# Patient Record
Sex: Female | Born: 1970 | Race: Black or African American | Hispanic: No | Marital: Single | Smoking: Never smoker
Health system: Southern US, Community
[De-identification: ages and names within clinical notes are randomized; demographics above are authoritative.]

## PROBLEM LIST (undated history)

## (undated) DIAGNOSIS — R7611 Nonspecific reaction to tuberculin skin test without active tuberculosis: Secondary | ICD-10-CM

## (undated) DIAGNOSIS — E785 Hyperlipidemia, unspecified: Secondary | ICD-10-CM

## (undated) DIAGNOSIS — I10 Essential (primary) hypertension: Secondary | ICD-10-CM

## (undated) HISTORY — PX: TUBAL LIGATION: SHX77

## (undated) HISTORY — DX: Nonspecific reaction to tuberculin skin test without active tuberculosis: R76.11

## (undated) HISTORY — DX: Essential (primary) hypertension: I10

## (undated) HISTORY — DX: Hyperlipidemia, unspecified: E78.5

---

## 2013-05-26 ENCOUNTER — Other Ambulatory Visit: Payer: Self-pay | Admitting: Internal Medicine

## 2013-05-26 DIAGNOSIS — Z1231 Encounter for screening mammogram for malignant neoplasm of breast: Secondary | ICD-10-CM

## 2013-06-16 ENCOUNTER — Ambulatory Visit: Payer: Self-pay

## 2013-06-19 ENCOUNTER — Ambulatory Visit
Admission: RE | Admit: 2013-06-19 | Discharge: 2013-06-19 | Disposition: A | Discharge status: Home / Self Care | Payer: Medicaid Other | Source: Ambulatory Visit | Attending: Internal Medicine | Admitting: Internal Medicine

## 2013-06-19 DIAGNOSIS — Z1231 Encounter for screening mammogram for malignant neoplasm of breast: Secondary | ICD-10-CM

## 2013-10-03 ENCOUNTER — Ambulatory Visit: Payer: Self-pay | Admitting: Family

## 2013-10-13 ENCOUNTER — Ambulatory Visit (INDEPENDENT_AMBULATORY_CARE_PROVIDER_SITE_OTHER): Payer: 59 | Admitting: Family

## 2013-10-13 ENCOUNTER — Encounter: Payer: Self-pay | Admitting: Family

## 2013-10-13 VITALS — BP 128/90 | HR 77 | Temp 97.9°F | Resp 16 | Ht 65.0 in | Wt 215.0 lb

## 2013-10-13 DIAGNOSIS — R0683 Snoring: Secondary | ICD-10-CM

## 2013-10-13 DIAGNOSIS — R0989 Other specified symptoms and signs involving the circulatory and respiratory systems: Secondary | ICD-10-CM

## 2013-10-13 DIAGNOSIS — M5432 Sciatica, left side: Secondary | ICD-10-CM | POA: Insufficient documentation

## 2013-10-13 DIAGNOSIS — R32 Unspecified urinary incontinence: Secondary | ICD-10-CM

## 2013-10-13 DIAGNOSIS — M5430 Sciatica, unspecified side: Secondary | ICD-10-CM

## 2013-10-13 DIAGNOSIS — N644 Mastodynia: Secondary | ICD-10-CM

## 2013-10-13 DIAGNOSIS — I10 Essential (primary) hypertension: Secondary | ICD-10-CM

## 2013-10-13 DIAGNOSIS — R0609 Other forms of dyspnea: Secondary | ICD-10-CM

## 2013-10-13 DIAGNOSIS — R7611 Nonspecific reaction to tuberculin skin test without active tuberculosis: Secondary | ICD-10-CM | POA: Insufficient documentation

## 2013-10-13 HISTORY — DX: Nonspecific reaction to tuberculin skin test without active tuberculosis: R76.11

## 2013-10-13 NOTE — Patient Instructions (Signed)
You can try aleve 220mg  twice daily as needed for left leg buring. Try to eliminate all caffeine from your diet. Add a vitamin E supplement.   Schedule a fasting physical at your earliest convenience. Welcome to [facility]!

## 2013-10-13 NOTE — Assessment & Plan Note (Signed)
BP stable.  Monitor off meds.

## 2013-10-13 NOTE — Assessment & Plan Note (Signed)
Recommend avoid caffeine, trial of vitamin E.

## 2013-10-13 NOTE — Assessment & Plan Note (Signed)
With fatigue and snoring ? OSA. Recommend sleep study.

## 2013-10-13 NOTE — Assessment & Plan Note (Signed)
Mild sciatic pain.  Trial of meloxicam.

## 2013-10-13 NOTE — Progress Notes (Signed)
Subjective:    Patient ID: Carmen Mann, female    DOB: [birth year], 42 y.o.   MRN: [id number]  HPI  Establish Care Pt new to establish care. She presents with chief complaint of breast Pain Pt reports intermittent bilateral breast pain x 1 year. Reports recent mammogram this year was normal. She cannot correlate breast pain with her menstrual cycle.  Told that she has dense breasts.  Denies breast leakage.   Reports that prior to the breast pain she has had formal work up for weight loss back in 2012.  Had DOE at that time. At that time her weight went down to 160.  She reports that her work up was negative.  She saw oncology, neurology.  Reports thyroid function was negative.  She does have fatigue still at times, but it is intermittent.    HTN-  Reports that back in 2010 she was treated on atenolol.  Does not specifically watch her sodium.    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   Urinary incontinence- started back in 2010.  Occurs at night with sleeping.  Occurs at night only. + snoring.     Review of Systems  Constitutional:       Weight is up/down for 6 months  HENT: Negative for rhinorrhea.   Respiratory: Negative for cough.   Cardiovascular: Negative for chest pain.  Gastrointestinal: Negative for vomiting and diarrhea.  Genitourinary: Negative for dysuria.  Musculoskeletal: Negative for arthralgias.  Skin: Negative for rash.  Neurological: Negative for headaches.  Hematological: Negative for adenopathy.  Psychiatric/Behavioral:       Denies depression/anxiety   Past Medical History  Diagnosis Date  . Hypertension   . Hyperlipidemia   . History of positive PPD, untreated     History   Social History  . Marital Status: Married    Spouse Name: N/A    Number of Children: N/A  . Years of Education: N/A   Occupational History  . Not on file.   Social History Main Topics  . Smoking status: Never Smoker   . Smokeless tobacco: Never Used  . Alcohol Use: Not on file   . Drug Use: Not on file  . Sexual Activity: Not on file   Other Topics Concern  . 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 Cesarean section      Family History  Problem Relation Age of Onset  . Hypertension Mother   . Cancer Father     prostate    No Known Allergies  No current outpatient prescriptions on file prior to visit.   No current facility-administered medications on file prior to visit.    BP 128/90  Pulse 77  Temp(Src) 97.9 F (36.6 C) (Oral)  Resp 16  Ht 5\' 5"  (1.651 m)  Wt 215 lb (97.523 kg)  BMI 35.78 kg/m2  SpO2 99%  LMP 09/25/2013        Objective:   Physical Exam  Constitutional: She is oriented to person, place, and time. She appears well-developed and well-nourished. No distress.  HENT:  Head: Normocephalic and atraumatic.  Cardiovascular: Normal rate and  regular rhythm.   No murmur heard. Pulmonary/Chest: Effort normal and breath sounds normal. No respiratory distress. She has no wheezes. She has no rales. She exhibits no tenderness.  Genitourinary:  Breasts: Examined lying and sitting.  Right: Without masses, retractions, discharge or axillary adenopathy.  Left: Without masses, retractions, discharge or axillary adenopathy.    Neurological: She is alert and oriented to person, place, and time.  Psychiatric: She has a normal mood and affect. Her behavior is normal. Judgment and thought content normal.  MS: bilateral LE strength is 5/5        Assessment & Plan:

## 2013-11-08 ENCOUNTER — Encounter (HOSPITAL_BASED_OUTPATIENT_CLINIC_OR_DEPARTMENT_OTHER): Payer: 59

## 2014-01-31 ENCOUNTER — Encounter: Payer: Self-pay | Admitting: Physician Assistant

## 2014-01-31 ENCOUNTER — Ambulatory Visit (INDEPENDENT_AMBULATORY_CARE_PROVIDER_SITE_OTHER): Payer: 59 | Admitting: Physician Assistant

## 2014-01-31 ENCOUNTER — Ambulatory Visit: Payer: 59 | Admitting: Family

## 2014-01-31 VITALS — BP 142/98 | HR 76 | Temp 98.7°F | Resp 14 | Ht 65.0 in | Wt 211.0 lb

## 2014-01-31 DIAGNOSIS — R0989 Other specified symptoms and signs involving the circulatory and respiratory systems: Secondary | ICD-10-CM

## 2014-01-31 DIAGNOSIS — R635 Abnormal weight gain: Secondary | ICD-10-CM

## 2014-01-31 DIAGNOSIS — F458 Other somatoform disorders: Secondary | ICD-10-CM

## 2014-01-31 LAB — TSH: TSH: 1.223 u[IU]/mL (ref 0.350–4.500)

## 2014-01-31 MED ORDER — DEXLANSOPRAZOLE 60 MG PO CPDR: 60.0000 mg | DELAYED_RELEASE_CAPSULE | Freq: Every day | ORAL | Status: DC

## 2014-01-31 NOTE — Progress Notes (Signed)
Pre visit review using our clinic review tool, if applicable. No additional management support is needed unless otherwise documented below in the visit note/SLS  

## 2014-01-31 NOTE — Patient Instructions (Addendum)
Please obtain labs. I will call you with your results.  Please take Dexilant daily.  Avoid late-night eating.  You will be contacted by GI for an appointment and further evaluation.  Dysphagia Swallowing problems (dysphagia) occur when solids and liquids seem to stick in your throat on the way down to your stomach, or the food takes longer to get to the stomach. Other symptoms include regurgitating food, noises coming from the throat, chest discomfort with swallowing, and a feeling of fullness or the feeling of something being stuck in your throat when swallowing. When blockage in your throat is complete it may be associated with drooling. CAUSES  Problems with swallowing may occur because of problems with the muscles. The food cannot be propelled in the usual manner into your stomach. You may have ulcers, scar tissue, or inflammation in the tube down which food travels from your mouth to your stomach (esophagus), which blocks food from passing normally into the stomach. Causes of inflammation include:  Acid reflux from your stomach into your esophagus.  Infection.  Radiation treatment for cancer.  Medicines taken without enough fluids to wash them down into your stomach. You may have nerve problems that prevent signals from being sent to the muscles of your esophagus to contract and move your food down to your stomach. Globus pharyngeus is a relatively common problem in which there is a sense of an obstruction or difficulty in swallowing, without any physical abnormalities of the swallowing passages being found. This problem usually improves over time with reassurance and testing to rule out other causes. DIAGNOSIS Dysphagia can be diagnosed and its cause can be determined by tests in which you swallow a white substance that helps illuminate the inside of your throat (contrast medium) while X-rays are taken. Sometimes a flexible telescope that is inserted down your throat (endoscopy) to look at your  esophagus and stomach is used. TREATMENT   If the dysphagia is caused by acid reflux or infection, medicines may be used.  If the dysphagia is caused by problems with your swallowing muscles, swallowing therapy may be used to help you strengthen your swallowing muscles.  If the dysphagia is caused by a blockage or mass, procedures to remove the blockage may be done. HOME CARE INSTRUCTIONS  Try to eat soft food that is easier to swallow and check your weight on a daily basis to be sure that it is not decreasing.  Be sure to drink liquids when sitting upright (not lying down). SEEK MEDICAL CARE IF:  You are losing weight because you are unable to swallow.  You are coughing when you drink liquids (aspiration).  You are coughing up partially digested food. SEEK IMMEDIATE MEDICAL CARE IF:  You are unable to swallow your own saliva .  You are having shortness of breath or a fever, or both.  You have a hoarse voice along with difficulty swallowing. MAKE SURE YOU:  Understand these instructions.  Will watch your condition.  Will get help right away if you are not doing well or get worse. Document Released: 11/13/2000 Document Revised: 07/19/2013 Document Reviewed: 05/05/2013 [facility] Patient Information 2014 [place], [place].

## 2014-02-02 DIAGNOSIS — R0989 Other specified symptoms and signs involving the circulatory and respiratory systems: Secondary | ICD-10-CM | POA: Insufficient documentation

## 2014-02-02 DIAGNOSIS — R09A2 Foreign body sensation, throat: Secondary | ICD-10-CM | POA: Insufficient documentation

## 2014-02-02 DIAGNOSIS — R635 Abnormal weight gain: Secondary | ICD-10-CM | POA: Insufficient documentation

## 2014-02-02 NOTE — Assessment & Plan Note (Signed)
Possibly due to silent reflux symptoms.  Will give sample of Dexilant for 2-week trial.  Has had ENT workup before for similar symptoms.  Workup was negative. Will refer patient to GI for evaluation and possible EGD or Swallow study.

## 2014-02-02 NOTE — Assessment & Plan Note (Signed)
Will check TSH

## 2014-02-02 NOTE — Progress Notes (Signed)
Patient presents to clinic today c/o lump in her throat and difficulty swallowing that has been present intermittently for the past 3 weeks.  Patient states she has had this previously over the past 5 months, but states it has not been this frequent.  Symptoms occuring a few times per week now. Denies inability to swallow solids or liquids.  "Just feels difficult sometimes". Endorses occasional cough.  Denies fever, chills, sweats, abdominal pain. Denies indigestion.  Symptoms are worse after meals and at bedtime with lying down.  Patient denies palpitations, chest pain, SOB or diaphoresis. Patient states she has seen ENT for this in the past with unremarkable workup.    Patient also requesting check of her thyroid due to unexplainable weight gain.  Has + family hx of tyroid disorder.  No personal history.  Denies excess fatigue, slowed mentation or cold insensitivity.  Past Medical History  Diagnosis Date  . Hypertension   . Hyperlipidemia   . History of positive PPD, untreated   . Positive PPD 10/13/2013    Hx BCG    Current Outpatient Prescriptions on File Prior to Visit  Medication Sig Dispense Refill  . vitamin E (VITAMIN E) 400 UNIT capsule Take 400 Units by mouth daily.       No current facility-administered medications on file prior to visit.    No Known Allergies  Family History  Problem Relation Age of Onset  . Hypertension Mother   . Cancer Father     prostate    History   Social History  . Marital Status: Married    Spouse Name: N/A    Number of Children: N/A  . Years of Education: N/A   Social History Main Topics  . Smoking status: Never Smoker   . Smokeless tobacco: Never Used  . Alcohol Use: None  . Drug Use: None  . Sexual Activity: None   Other Topics Concern  . 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  All other ROS are negative.  BP 142/98  Pulse 76  Temp(Src) 98.7 F (37.1 C) (Oral)  Resp 14  Ht 5\' 5"  (1.651 m)  Wt 211 lb (95.709 kg)  BMI 35.11 kg/m2  SpO2 98%  LMP 01/31/2014  Physical Exam  Constitutional: She is oriented to person, place, and time and well-developed, well-nourished, and in no distress.  HENT:  Head: Normocephalic and atraumatic.  Right Ear: Tympanic membrane, external ear and ear canal normal.  Left Ear: Tympanic membrane, external ear and ear canal normal.  Nose: Nose normal.  Mouth/Throat: Uvula is midline, oropharynx is clear and moist and mucous membranes are normal. No oropharyngeal exudate, posterior oropharyngeal edema, posterior oropharyngeal erythema or tonsillar abscesses.  Eyes: Conjunctivae are normal. Pupils are equal, round, and reactive to light.  Neck: Normal range of motion. Neck supple. No tracheal deviation present. No mass and no thyromegaly present.  Cardiovascular: Normal rate, regular rhythm and normal heart sounds.   Pulmonary/Chest: Effort normal and breath sounds normal. No stridor.  Abdominal: Soft. Bowel sounds are normal. She exhibits no distension and no mass. There is no tenderness. There is no rebound and no guarding.  Lymphadenopathy:    She has no cervical adenopathy.  Neurological: She is alert and oriented to person, place, and time. No cranial nerve deficit.  Skin: Skin is warm and dry. No rash noted.  Psychiatric: Affect normal.   Recent Results (from the past 2160 hour(s))  TSH     Status: None   Collection Time    01/31/14  1:32 PM      Result Value Ref Range   TSH 1.223  0.350 - 4.500 uIU/mL   Assessment/Plan: Globus sensation Possibly due to silent reflux symptoms.  Will give sample of Dexilant for 2-week trial.  Has had ENT workup before for similar symptoms.  Workup was negative. Will refer patient to GI  for evaluation and possible EGD or Swallow study.  Weight gain Will check TSH

## 2014-02-27 ENCOUNTER — Encounter: Payer: Self-pay | Admitting: Internal Medicine

## 2014-04-06 ENCOUNTER — Encounter: Payer: Self-pay | Admitting: Gastroenterology

## 2014-04-18 ENCOUNTER — Ambulatory Visit: Payer: 59 | Admitting: Internal Medicine

## 2014-04-25 ENCOUNTER — Telehealth: Payer: Self-pay | Admitting: Internal Medicine

## 2014-04-25 ENCOUNTER — Encounter: Payer: Self-pay | Admitting: Internal Medicine

## 2014-04-25 NOTE — Telephone Encounter (Signed)
Note not needed 

## 2014-04-26 ENCOUNTER — Encounter: Payer: Self-pay | Admitting: Internal Medicine

## 2014-07-02 ENCOUNTER — Ambulatory Visit (INDEPENDENT_AMBULATORY_CARE_PROVIDER_SITE_OTHER): Payer: 59 | Admitting: Internal Medicine

## 2014-07-02 ENCOUNTER — Encounter: Payer: Self-pay | Admitting: Internal Medicine

## 2014-07-02 VITALS — BP 130/70 | HR 80 | Ht 65.0 in | Wt 192.4 lb

## 2014-07-02 DIAGNOSIS — R1310 Dysphagia, unspecified: Secondary | ICD-10-CM

## 2014-07-02 DIAGNOSIS — R0989 Other specified symptoms and signs involving the circulatory and respiratory systems: Secondary | ICD-10-CM

## 2014-07-02 DIAGNOSIS — F458 Other somatoform disorders: Secondary | ICD-10-CM

## 2014-07-02 NOTE — Progress Notes (Signed)
[facility]  Carmen Mann    [id number]    01-28-1971    Assessment and Plan/Recommendations: Globus Sensation: Chronic history.Cause not clear - Ba swallow to assess    Dysphagia: Vague history but warrants barium swallow and will most likely need EGD as well.we have scheduled the EGD in anticipation - The risks and benefits as well as alternatives of endoscopic procedure(s) have been discussed and reviewed. All questions answered. The patient agrees to proceed.  Cancer fear - she is concerned that the sxs above could ean she has cancer - EGD should reassure - I doubt chronic globus related to cancer    HPI: Carmen Mann is a 43 y.o. AA female with a history of globus sensation and dysphagia presenting for evaluation for endoscopy. Pt states she has had intermittent feelings of lumps in her throat for upwards of 6 years. She was evaluated by an ENT 6-7 years ago for the same with esophagoscopy that was unremarkable.  She states it had been intermittent, but for the past 1-2 years it has been relatively constant. She reports that she did have dysphagia to solid foods up until 6 months ago, but has not had trouble since.  She described it as feeling like it was stuck in her throat, behind her cricoid cartilage and did endorse occasional regurgitation of solid foods during the same time period.  For the past 6 months, her only symptoms have been globus sensation and occasional sore throat.  Today she denies fever, chills, N/V, diarrhea, dysphagia to liquids, cough, and hoarseness.  She does have occasional constipation with some relief from miralax and an "oil" that she purchased online.  She has had 20 lb weight gain over 3-4 months, denies any inciting factors, stressors, or change in diet.  She denies thyroid symptoms and had normal thyroid work up through her PCP.  She denies ABD pain today, but states she has it sometimes daily. [place] in [place] ago. She was negative for appendicitis or any acute abdominal process.    Outpatient Encounter Prescriptions as of 07/02/2014  Medication Sig  . [DISCONTINUED] dexlansoprazole (DEXILANT) 60 MG capsule Take 1 capsule (60 mg total) by mouth daily.  . [DISCONTINUED] vitamin E (VITAMIN E) 400 UNIT capsule Take 400 Units by mouth daily.    Allergies as of 07/02/2014  . (No Known Allergies)    Past Medical History  Diagnosis Date  . Hypertension   . Hyperlipidemia   . History of positive PPD, untreated   . Positive PPD 10/13/2013    Hx BCG    Past Surgical History  Procedure Laterality Date  . Cesarean section    . Tubal ligation      Family History  Problem Relation Age of Onset  . Hypertension Mother   . Prostate cancer Father   . Heart disease      History   Social History  . Marital Status: Married                 Occupational History  . RN [employer]   Social History Main Topics  . Smoking status: Never Smoker   . Smokeless tobacco: Never Used  . Alcohol Use: No  . 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 All other ROS negative or as per HPI  Physical Exam: BP 130/70  Pulse 80  Ht 5\' 5"  (1.651 m)  Wt 192 lb 6.4 oz (87.272 kg)  BMI 32.02 kg/m2 Constitutional: WDWN NAD Eyes: anicteric Mouth: oral and posterior pharynx free of lesions, symmetrical elevation of soft palate. Neck: supple, no mass or thyromegaly. Normal movement with swallowing. Lungs: clear to auscultation bilaterally Cardiovascular: S1S2 with regular rate and rhythm, no rubs murmurs or gallops Abdomen: soft, nontender, nondistended, no masses or organomegaly, normal bowel  sounds Extremities: no lower extremity edema  Skin: no rash Neuro: alert and oriented x 3 Psych: Flat affect  Data Reviewed: Notes from previous PCP visits.  Antonieta Pertillery, Kelliann Pendergraph A [employer], [place]PA Student 07/02/2014 11:21 AM     I have personally seen the patient, reviewed and repeated key elements of the history and physical and participated in formation of the assessment and plan the student has documented.  Iva Booparl E. Gessner, MD, Clementeen GrahamFACG  Cc: Malva Coganody, [place], [place] JerseyPA-C

## 2014-07-02 NOTE — Patient Instructions (Addendum)
You have been scheduled for an endoscopy. Please follow written instructions given to you at your visit today. If you use inhalers (even only as needed), please bring them with you on the day of your procedure. Your physician has requested that you go to www.startemmi.com and enter the access code given to you at your visit today. This web site gives a general overview about your procedure. However, you should still follow specific instructions given to you by our office regarding your preparation for the procedure.  [facility] of the [facility]) on 07/11/14 at 9:30am. Please arrive 15 minutes prior to your appointment for registration. Make certain not to have anything to eat or drink 3 hours prior to your test. If you need to reschedule for any reason, please contact radiology at [phone] to do so. __________________________________________________________________ A barium swallow is an examination that concentrates on views of the esophagus. This tends to be a double contrast exam (barium and two liquids which, when combined, create a gas to distend the wall of the oesophagus) or single contrast (non-ionic iodine based). The study is usually tailored to your symptoms so a good history is essential. Attention is paid during the study to the form, structure and configuration of the esophagus, looking for functional disorders (such as aspiration, dysphagia, achalasia, motility and reflux) EXAMINATION You may be asked to change into a gown, depending on the type of swallow being performed. A radiologist and radiographer will perform the procedure. The radiologist will advise you of the type of contrast selected for your procedure and direct you during the exam. You will be asked to stand, sit or lie in several different positions and to hold a small amount of fluid in your mouth before being asked to swallow while the imaging is  performed .In some instances you may be asked to swallow barium coated marshmallows to assess the motility of a solid food bolus. The exam can be recorded as a digital or video fluoroscopy procedure. POST PROCEDURE It will take 1-2 days for the barium to pass through your system. To facilitate this, it is important, unless otherwise directed, to increase your fluids for the next 24-48hrs and to resume your normal diet.  This test typically takes about 30 minutes to perform. __________________________________________________________________________________  I appreciate the opportunity to care for you.

## 2014-07-11 ENCOUNTER — Ambulatory Visit (HOSPITAL_COMMUNITY)
Admission: RE | Admit: 2014-07-11 | Discharge: 2014-07-11 | Disposition: A | Discharge status: Home / Self Care | Payer: 59 | Source: Ambulatory Visit | Attending: Internal Medicine | Admitting: Internal Medicine

## 2014-07-11 DIAGNOSIS — R0989 Other specified symptoms and signs involving the circulatory and respiratory systems: Secondary | ICD-10-CM

## 2014-07-11 DIAGNOSIS — F458 Other somatoform disorders: Secondary | ICD-10-CM | POA: Insufficient documentation

## 2014-07-11 DIAGNOSIS — R1310 Dysphagia, unspecified: Secondary | ICD-10-CM

## 2014-07-11 DIAGNOSIS — R09A2 Foreign body sensation, throat: Secondary | ICD-10-CM

## 2014-07-12 NOTE — Progress Notes (Signed)
Quick Note:  This is normal EGD is next and should be scheduled ______

## 2014-08-07 ENCOUNTER — Telehealth: Payer: Self-pay | Admitting: Internal Medicine

## 2014-08-07 ENCOUNTER — Encounter: Payer: 59 | Admitting: Internal Medicine

## 2014-08-07 NOTE — Telephone Encounter (Signed)
No charge. 

## 2014-10-19 ENCOUNTER — Emergency Department (HOSPITAL_BASED_OUTPATIENT_CLINIC_OR_DEPARTMENT_OTHER): Payer: 59

## 2014-10-19 ENCOUNTER — Encounter (HOSPITAL_BASED_OUTPATIENT_CLINIC_OR_DEPARTMENT_OTHER): Payer: Self-pay | Admitting: *Deleted

## 2014-10-19 ENCOUNTER — Emergency Department (HOSPITAL_BASED_OUTPATIENT_CLINIC_OR_DEPARTMENT_OTHER)
Admission: EM | Admit: 2014-10-19 | Discharge: 2014-10-19 | Disposition: A | Discharge status: Home / Self Care | Payer: 59 | Attending: Emergency Medicine | Admitting: Emergency Medicine

## 2014-10-19 DIAGNOSIS — S73101A Unspecified sprain of right hip, initial encounter: Secondary | ICD-10-CM | POA: Diagnosis not present

## 2014-10-19 DIAGNOSIS — Y998 Other external cause status: Secondary | ICD-10-CM | POA: Insufficient documentation

## 2014-10-19 DIAGNOSIS — Y9389 Activity, other specified: Secondary | ICD-10-CM | POA: Diagnosis not present

## 2014-10-19 DIAGNOSIS — Y9289 Other specified places as the place of occurrence of the external cause: Secondary | ICD-10-CM | POA: Insufficient documentation

## 2014-10-19 DIAGNOSIS — W108XXA Fall (on) (from) other stairs and steps, initial encounter: Secondary | ICD-10-CM | POA: Diagnosis not present

## 2014-10-19 DIAGNOSIS — I10 Essential (primary) hypertension: Secondary | ICD-10-CM | POA: Insufficient documentation

## 2014-10-19 DIAGNOSIS — S79911A Unspecified injury of right hip, initial encounter: Secondary | ICD-10-CM | POA: Diagnosis present

## 2014-10-19 DIAGNOSIS — Z8639 Personal history of other endocrine, nutritional and metabolic disease: Secondary | ICD-10-CM | POA: Diagnosis not present

## 2014-10-19 DIAGNOSIS — M25551 Pain in right hip: Secondary | ICD-10-CM

## 2014-10-19 NOTE — ED Notes (Signed)
Pt states tripped over toys last pm,  C/o rt hip pain,  ambulatory

## 2014-10-19 NOTE — ED Provider Notes (Signed)
CSN: [id number]     Arrival date & time 10/19/14  [id number] History   First MD Initiated Contact with Patient 10/19/14 0449     Chief Complaint  Patient presents with  . Hip Pain    Patient is a 43 y.o. female presenting with hip pain. The history is provided by the patient.  Hip Pain This is a new problem. The current episode started 6 to 12 hours ago. The problem occurs constantly. The problem has been gradually worsening. Pertinent negatives include no chest pain, no abdominal pain, no headaches and no shortness of breath. The symptoms are aggravated by walking. The symptoms are relieved by rest. She has tried rest for the symptoms. The treatment provided mild relief.  Patient reports she tripped over a toy and fell down 4 stairs No head injury.  No LOC.  No neck or back pain She reports pain in right hip only No weakness is reported in her legs.  Past Medical History  Diagnosis Date  . Hypertension   . Hyperlipidemia   . History of positive PPD, untreated   . 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 Cesarean section    . Tubal ligation     Family History  Problem Relation Age of Onset  . Hypertension Mother   . Prostate cancer Father   . Heart disease     History  Substance Use Topics  . Smoking status: Never Smoker   . Smokeless tobacco: Never Used  . Alcohol Use: No   OB History    No data available     Review of Systems  Constitutional: Negative for fever.  Respiratory: Negative for shortness of breath.   Cardiovascular: Negative for chest pain.  Gastrointestinal: Negative for vomiting and abdominal pain.  Musculoskeletal: Positive for arthralgias. Negative for back pain and neck pain.  Neurological: Negative for weakness, numbness and headaches.  All other systems reviewed and are negative.     Allergies  Review of patient's allergies indicates no known allergies.  Home Medications   Prior to Admission  medications   Not on File   BP 154/99 mmHg  Pulse 79  Temp(Src) 100 F (37.8 C) (Oral)  Resp 18  Ht 5\' 5"  (1.651 m)  Wt 190 lb (86.183 kg)  BMI 31.62 kg/m2  SpO2 100% Physical Exam CONSTITUTIONAL: Well developed/well nourished HEAD: Normocephalic/atraumatic EYES: EOMI/PERRL ENMT: Mucous membranes moist NECK: supple no meningeal signs SPINE/BACK:entire spine nontender CV: S1/S2 noted, no murmurs/rubs/gallops noted LUNGS: Lungs are clear to auscultation bilaterally, no apparent distress ABDOMEN: soft, nontender, no rebound or guarding, bowel sounds noted throughout abdomen GU:no cva tenderness NEURO: Pt is awake/alert/appropriate, moves all extremitiesx4.  No facial droop.   EXTREMITIES: pulses normal/equal, full ROM.  Tenderness to palpation of right hip and pain with ROM of right hip.  No bruising noted.  No deformity noted.  All other extremities/joints palpated/ranged and nontender SKIN: warm, color normal PSYCH: no abnormalities of mood noted, alert and oriented to situation  ED Course  Procedures  5:00 AM Pt does not want any pain medications at this time Xray pending 5:26 AM Xray negative Pt can ambulate.  Low suspicion for occult fracture Pt is appropriate for d/c home She does not want any pain meds   Imaging Review Dg Hip Complete Right  10/19/2014   CLINICAL DATA:  Larey SeatFell down steps.  Right hip pain.  EXAM: RIGHT HIP - COMPLETE 2+ VIEW  COMPARISON:  None.  FINDINGS: There is no evidence of hip fracture or dislocation. There is no evidence of arthropathy or other focal bone abnormality.  IMPRESSION: Negative.   Electronically Signed   By: Burman NievesWilliam  Stevens M.D.   On: 10/19/2014 05:18      MDM   Final diagnoses:  Right hip pain  Sprain of right hip, initial encounter    Nursing notes including past medical history and social history reviewed and considered in documentation xrays/imaging reviewed by myself and considered during evaluation     Joya Gaskinsonald W  Ande Therrell, MD 10/19/14 [phone]

## 2014-10-19 NOTE — ED Notes (Signed)
Fell over toys last pm, now having rt hip pain,  ambulatory

## 2015-03-30 ENCOUNTER — Emergency Department (HOSPITAL_COMMUNITY)
Admission: EM | Admit: 2015-03-30 | Discharge: 2015-03-30 | Disposition: A | Discharge status: Home / Self Care | Payer: 59 | Source: Home / Self Care | Attending: Family Medicine | Admitting: Family Medicine

## 2015-03-30 ENCOUNTER — Encounter (HOSPITAL_COMMUNITY): Payer: Self-pay | Admitting: Emergency Medicine

## 2015-03-30 DIAGNOSIS — N12 Tubulo-interstitial nephritis, not specified as acute or chronic: Secondary | ICD-10-CM

## 2015-03-30 LAB — URINALYSIS, ROUTINE W REFLEX MICROSCOPIC
Bilirubin Urine: NEGATIVE
GLUCOSE, UA: NEGATIVE mg/dL
Hgb urine dipstick: NEGATIVE
KETONES UR: NEGATIVE mg/dL
LEUKOCYTES UA: NEGATIVE
NITRITE: POSITIVE — AB
PROTEIN: NEGATIVE mg/dL
SPECIFIC GRAVITY, URINE: 1.035 — AB (ref 1.005–1.030)
Urobilinogen, UA: 1 mg/dL (ref 0.0–1.0)
pH: 5 (ref 5.0–8.0)

## 2015-03-30 LAB — URINE MICROSCOPIC-ADD ON

## 2015-03-30 LAB — CBC WITH DIFFERENTIAL/PLATELET
Basophils Absolute: 0 10*3/uL (ref 0.0–0.1)
Basophils Relative: 1 % (ref 0–1)
EOS PCT: 4 % (ref 0–5)
Eosinophils Absolute: 0.2 10*3/uL (ref 0.0–0.7)
HEMATOCRIT: 31.9 % — AB (ref 36.0–46.0)
Hemoglobin: 11 g/dL — ABNORMAL LOW (ref 12.0–15.0)
LYMPHS PCT: 36 % (ref 12–46)
Lymphs Abs: 1.9 10*3/uL (ref 0.7–4.0)
MCH: 30.5 pg (ref 26.0–34.0)
MCHC: 34.5 g/dL (ref 30.0–36.0)
MCV: 88.4 fL (ref 78.0–100.0)
Monocytes Absolute: 0.4 10*3/uL (ref 0.1–1.0)
Monocytes Relative: 7 % (ref 3–12)
NEUTROS PCT: 53 % (ref 43–77)
Neutro Abs: 2.8 10*3/uL (ref 1.7–7.7)
Platelets: 231 10*3/uL (ref 150–400)
RBC: 3.61 MIL/uL — ABNORMAL LOW (ref 3.87–5.11)
RDW: 12.9 % (ref 11.5–15.5)
WBC: 5.4 10*3/uL (ref 4.0–10.5)

## 2015-03-30 LAB — COMPREHENSIVE METABOLIC PANEL
ALT: 14 U/L (ref 0–35)
AST: 16 U/L (ref 0–37)
Albumin: 3.8 g/dL (ref 3.5–5.2)
Alkaline Phosphatase: 87 U/L (ref 39–117)
Anion gap: 8 (ref 5–15)
BILIRUBIN TOTAL: 0.6 mg/dL (ref 0.3–1.2)
BUN: 14 mg/dL (ref 6–23)
CHLORIDE: 100 mmol/L (ref 96–112)
CO2: 27 mmol/L (ref 19–32)
Calcium: 9.3 mg/dL (ref 8.4–10.5)
Creatinine, Ser: 0.75 mg/dL (ref 0.50–1.10)
GFR calc non Af Amer: >90 mL/min (ref >90)
Glucose, Bld: 98 mg/dL (ref 70–99)
POTASSIUM: 3.6 mmol/L (ref 3.5–5.1)
Sodium: 135 mmol/L (ref 135–145)
Total Protein: 7.8 g/dL (ref 6.0–8.3)

## 2015-03-30 LAB — POCT URINALYSIS DIP (DEVICE)
Glucose, UA: NEGATIVE mg/dL
Glucose, UA: NEGATIVE mg/dL
Hgb urine dipstick: NEGATIVE
Ketones, ur: NEGATIVE mg/dL
LEUKOCYTES UA: NEGATIVE
Leukocytes, UA: NEGATIVE
NITRITE: POSITIVE — AB
Nitrite: POSITIVE — AB
PROTEIN: 30 mg/dL — AB
Protein, ur: NEGATIVE mg/dL
Specific Gravity, Urine: >=1.03 (ref 1.005–1.030)
UROBILINOGEN UA: 1 mg/dL (ref 0.0–1.0)
Urobilinogen, UA: 1 mg/dL (ref 0.0–1.0)
pH: 5 (ref 5.0–8.0)
pH: 5.5 (ref 5.0–8.0)

## 2015-03-30 LAB — CK: CK TOTAL: 108 U/L (ref 7–177)

## 2015-03-30 LAB — POCT PREGNANCY, URINE: PREG TEST UR: NEGATIVE

## 2015-03-30 MED ORDER — CIPROFLOXACIN HCL 500 MG PO TABS: 500.0000 mg | ORAL_TABLET | Freq: Two times a day (BID) | ORAL | Status: DC

## 2015-03-30 MED ORDER — CEFTRIAXONE SODIUM 1 G IJ SOLR
INTRAMUSCULAR | Status: AC
  Filled 2015-03-30: qty 10

## 2015-03-30 MED ORDER — CEFTRIAXONE SODIUM 1 G IJ SOLR
1.0000 g | Freq: Once | INTRAMUSCULAR | Status: AC
  Administered 2015-03-30: 1 g via INTRAMUSCULAR

## 2015-03-30 MED ORDER — LIDOCAINE HCL (PF) 1 % IJ SOLN
INTRAMUSCULAR | Status: AC
Start: 2015-03-30 — End: 2015-03-30
  Filled 2015-03-30: qty 5

## 2015-03-30 NOTE — Discharge Instructions (Signed)
Pyelonephritis, Adult °Pyelonephritis is a kidney infection. A kidney infection can happen quickly, or it can last for a long time. °HOME CARE  °· Take your medicine (antibiotics) as told. Finish it even if you start to feel better. °· Keep all doctor visits as told. °· Drink enough fluids to keep your pee (urine) clear or pale yellow. °· Only take medicine as told by your doctor. °GET HELP RIGHT AWAY IF:  °· You have a fever or lasting symptoms for more than 2-3 days. °· You have a fever and your symptoms suddenly get worse. °· You cannot take your medicine or drink fluids as told. °· You have chills and shaking. °· You feel very weak or pass out (faint). °· You do not feel better after 2 days. °MAKE SURE YOU: °· Understand these instructions. °· Will watch your condition. °· Will get help right away if you are not doing well or get worse. °Document Released: 12/24/2004 Document Revised: 05/17/2012 Document Reviewed: 05/06/2011 °Exit[employer]® Patient Information ©2015 [employer]. This information is not intended to replace advice given to you by your health care provider. Make sure you discuss any questions you have with your health care provider. ° °

## 2015-03-30 NOTE — ED Notes (Signed)
Patient not ready for discharge, post injection delay prior to discharge.

## 2015-03-30 NOTE — ED Notes (Signed)
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  Had access to sterile cups.  Patient brought the first void of the day in with her to this visit.  Patient also voided while here. Both urine specimens dark, but specimen brought from home was darker than urine obtained in department.  Both specimens were tested.  Patient had 2 urine dips, 2 different specimens.

## 2015-03-30 NOTE — ED Notes (Signed)
Seen by zack baker, pa prior to this nurse

## 2015-03-30 NOTE — ED Notes (Signed)
Patient has "discolored urine, severe weakness, lower abdominal pain".  Episodes of weakness for 6 years.  Discolored urine for 2-4 days.

## 2015-03-30 NOTE — ED Provider Notes (Signed)
CSN: [id number]     Arrival date & time 03/30/15  1029 History   First MD Initiated Contact with Patient 03/30/15 1126     Chief Complaint  Patient presents with  . Abdominal Pain   (Consider location/radiation/quality/duration/timing/severity/associated sxs/prior Treatment) HPI     44 year old female presents complaining of severe weakness and dark urine. This problem has been intermittent for 6 years, she will have this problem occasionally and it will be self-limited. This current episode started about 4 or 5 days ago. She describes severe weakness over her entire body without any areas of focal weakness or numbness. She just feels extremely fatigued. This morning her urine was very dark which has her worried. She has an appointment with her doctor next week but she wanted to be seen today. She denies any recent illness or any other systemic symptoms. She is in no pain today. Denies any rash. She is still urinating a normal amount. She collected a urine sample at work which she has brought with her.  Past Medical History  Diagnosis Date  . Hypertension   . Hyperlipidemia   . History of positive PPD, untreated   . 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 Cesarean section    . Tubal ligation     Family History  Problem Relation Age of Onset  . Hypertension Mother   . Prostate cancer Father   . Heart disease     History  Substance Use Topics  . Smoking status: Never Smoker   . Smokeless tobacco: Never Used  . Alcohol Use: No   OB History    No data available     Review of Systems  Genitourinary:       Dark urine  Neurological: Positive for weakness.  All other systems reviewed and are negative.   Allergies  Review of patient's allergies indicates no known allergies.  Home Medications   Prior to Admission medications   Medication Sig Start Date End Date Taking? Authorizing Provider  hydrochlorothiazide (HYDRODIURIL) 25  MG tablet Take 25 mg by mouth daily.   Yes Historical Provider, MD  ciprofloxacin (CIPRO) 500 MG tablet Take 1 tablet (500 mg total) by mouth every 12 (twelve) hours. 03/30/15   Adrian Blackwater Cele Mote, PA-C   BP 124/86 mmHg  Pulse 77  Temp(Src) 98.7 F (37.1 C) (Oral)  Resp 16  SpO2 100%  LMP 03/16/2015 Physical Exam  Constitutional: She is oriented to person, place, and time. Vital signs are normal. She appears well-developed and well-nourished. She appears distressed (she appears fatigued).  HENT:  Head: Normocephalic and atraumatic.  Right Ear: External ear normal.  Left Ear: External ear normal.  Nose: Nose normal.  Mouth/Throat: Oropharynx is clear and moist. No oropharyngeal exudate.  Eyes: Conjunctivae are normal.  Neck: Normal range of motion. Neck supple. No JVD present. No tracheal deviation present. No thyromegaly present.  Cardiovascular: Normal rate, regular rhythm and normal heart sounds.   Pulmonary/Chest: Effort normal and breath sounds normal. No respiratory distress.  Abdominal: Soft. She exhibits no mass. There is no tenderness. There is no rebound and no guarding.  Musculoskeletal: Normal range of motion. She exhibits no edema.  Lymphadenopathy:    She has no cervical adenopathy.  Neurological: She is alert and oriented to person, place, and time. She has normal strength. Coordination normal.  Skin: Skin is warm and dry. No rash noted. She is not diaphoretic.  Psychiatric: She has  a normal mood and affect. Judgment normal.  Nursing note and vitals reviewed.   The urine sample she has brought with her today is light brown, dark tea colored  ED Course  Procedures (including critical care time) Labs Review Labs Reviewed  CBC WITH DIFFERENTIAL/PLATELET - Abnormal; Notable for the following:    RBC 3.61 (*)    Hemoglobin 11.0 (*)    HCT 31.9 (*)    All other components within normal limits  POCT URINALYSIS DIP (DEVICE) - Abnormal; Notable for the following:     Bilirubin Urine SMALL (*)    Ketones, ur TRACE (*)    Hgb urine dipstick TRACE (*)    Protein, ur 30 (*)    Nitrite POSITIVE (*)    All other components within normal limits  POCT URINALYSIS DIP (DEVICE) - Abnormal; Notable for the following:    Bilirubin Urine SMALL (*)    Nitrite POSITIVE (*)    All other components within normal limits  URINE CULTURE  COMPREHENSIVE METABOLIC PANEL  CK  URINALYSIS, ROUTINE W REFLEX MICROSCOPIC  POCT PREGNANCY, URINE    Imaging Review No results found.   MDM   1. Pyelonephritis    Urinalysis shows nitrites, protein, consistent with infection. CBC, CMP, and CK are normal. Treat for pyelonephritis with 1 g of ceftriaxone here, discharge with Cipro twice a day for 10 days. Increase fluids. Follow-up when necessary if no improvement in a few days, ED if worsening  Meds ordered this encounter  Medications  . hydrochlorothiazide (HYDRODIURIL) 25 MG tablet    Sig: Take 25 mg by mouth daily.  . cefTRIAXone (ROCEPHIN) injection 1 g    Sig:   . ciprofloxacin (CIPRO) 500 MG tablet    Sig: Take 1 tablet (500 mg total) by mouth every 12 (twelve) hours.    Dispense:  20 tablet    Refill:  0    Order Specific Question:  Supervising Provider    Answer:  Bradd CanaryKINDL, JAMES D [[id number]]       Graylon GoodZachary H Gwyn Mehring, PA-C 03/30/15 [phone]

## 2015-04-01 LAB — URINE CULTURE: Colony Count: >=100000

## 2015-04-01 NOTE — ED Notes (Signed)
Urine culture:  >100,000 colonies E. Coli  Pt. adequately treated with Cipro. Carmen Mann, Carmen Mann 04/01/2015

## 2015-04-02 ENCOUNTER — Telehealth (HOSPITAL_BASED_OUTPATIENT_CLINIC_OR_DEPARTMENT_OTHER): Payer: Self-pay | Admitting: Emergency Medicine

## 2015-04-02 NOTE — Telephone Encounter (Signed)
Post ED Visit - Positive Culture Follow-up  Culture report reviewed by antimicrobial stewardship pharmacist: []  Carmen Mann, Pharm.D., BCPS [x]  Carmen Mann, Pharm.D., BCPS []  Carmen Mann, Pharm.D., BCPS []  Carmen Mann, [address]Ph[address] BCPS, AAHIVP []  Carmen Mann, Pharm.D., BCPS, AAHIVP []  Carmen Mann, [address]Ph[address] BCPS  Positive urine culture E. Coli Treated with ciprofloxacin, organism sensitive to the same and no further patient follow-up is required at this time.  Carmen Mann, Carmen Mann 04/02/2015, 9:20 AM

## 2016-01-30 DIAGNOSIS — Z0000 Encounter for general adult medical examination without abnormal findings: Secondary | ICD-10-CM | POA: Diagnosis not present

## 2016-01-30 DIAGNOSIS — Z7689 Persons encountering health services in other specified circumstances: Secondary | ICD-10-CM | POA: Diagnosis not present

## 2016-01-30 DIAGNOSIS — Z113 Encounter for screening for infections with a predominantly sexual mode of transmission: Secondary | ICD-10-CM | POA: Diagnosis not present

## 2016-01-30 DIAGNOSIS — Z1389 Encounter for screening for other disorder: Secondary | ICD-10-CM | POA: Diagnosis not present

## 2016-01-30 DIAGNOSIS — Z0100 Encounter for examination of eyes and vision without abnormal findings: Secondary | ICD-10-CM | POA: Diagnosis not present

## 2016-01-30 DIAGNOSIS — Z136 Encounter for screening for cardiovascular disorders: Secondary | ICD-10-CM | POA: Diagnosis not present

## 2016-01-30 DIAGNOSIS — Z131 Encounter for screening for diabetes mellitus: Secondary | ICD-10-CM | POA: Diagnosis not present

## 2016-01-30 DIAGNOSIS — Z01118 Encounter for examination of ears and hearing with other abnormal findings: Secondary | ICD-10-CM | POA: Diagnosis not present

## 2016-02-27 ENCOUNTER — Other Ambulatory Visit: Payer: Self-pay | Admitting: Internal Medicine

## 2016-02-27 DIAGNOSIS — Z1231 Encounter for screening mammogram for malignant neoplasm of breast: Secondary | ICD-10-CM

## 2016-05-14 ENCOUNTER — Other Ambulatory Visit: Payer: Self-pay | Admitting: Internal Medicine

## 2016-05-14 DIAGNOSIS — N63 Unspecified lump in unspecified breast: Secondary | ICD-10-CM

## 2016-05-14 DIAGNOSIS — N644 Mastodynia: Secondary | ICD-10-CM

## 2016-05-18 ENCOUNTER — Other Ambulatory Visit: Payer: 59

## 2016-05-19 ENCOUNTER — Ambulatory Visit
Admission: RE | Admit: 2016-05-19 | Discharge: 2016-05-19 | Disposition: A | Discharge status: Home / Self Care | Payer: BLUE CROSS/BLUE SHIELD | Source: Ambulatory Visit | Attending: Internal Medicine | Admitting: Internal Medicine

## 2016-05-19 DIAGNOSIS — N644 Mastodynia: Secondary | ICD-10-CM

## 2016-05-19 DIAGNOSIS — N63 Unspecified lump in unspecified breast: Secondary | ICD-10-CM

## 2017-04-06 ENCOUNTER — Emergency Department (HOSPITAL_BASED_OUTPATIENT_CLINIC_OR_DEPARTMENT_OTHER)
Admission: EM | Admit: 2017-04-06 | Discharge: 2017-04-07 | Disposition: A | Discharge status: Home / Self Care | Payer: BLUE CROSS/BLUE SHIELD | Attending: Emergency Medicine | Admitting: Emergency Medicine

## 2017-04-06 ENCOUNTER — Encounter (HOSPITAL_BASED_OUTPATIENT_CLINIC_OR_DEPARTMENT_OTHER): Payer: Self-pay

## 2017-04-06 DIAGNOSIS — R030 Elevated blood-pressure reading, without diagnosis of hypertension: Secondary | ICD-10-CM

## 2017-04-06 DIAGNOSIS — I10 Essential (primary) hypertension: Secondary | ICD-10-CM | POA: Insufficient documentation

## 2017-04-06 NOTE — ED Triage Notes (Addendum)
Pt c/o high blood pressure all day with "a little bit of chest pain," denies SOB or edema.  Pt states she is compliant with her BP meds.  When asked if she is having any numbness or weakness, pt endorses right hand weakness at first and then when asked to elaborate, she states "it is fine."  The "weakness" started at 1600.  No weakness appreciated upon exam, no arm drift, no facial droop either.

## 2017-04-07 LAB — BASIC METABOLIC PANEL
Anion gap: 4 — ABNORMAL LOW (ref 5–15)
BUN: 15 mg/dL (ref 6–20)
CHLORIDE: 108 mmol/L (ref 101–111)
CO2: 24 mmol/L (ref 22–32)
Calcium: 8.4 mg/dL — ABNORMAL LOW (ref 8.9–10.3)
Creatinine, Ser: 0.74 mg/dL (ref 0.44–1.00)
Glucose, Bld: 113 mg/dL — ABNORMAL HIGH (ref 65–99)
Potassium: 3.5 mmol/L (ref 3.5–5.1)
Sodium: 136 mmol/L (ref 135–145)

## 2017-04-07 LAB — CBC WITH DIFFERENTIAL/PLATELET
Basophils Absolute: 0 10*3/uL (ref 0.0–0.1)
Basophils Relative: 0 %
EOS PCT: 2 %
Eosinophils Absolute: 0.1 10*3/uL (ref 0.0–0.7)
HCT: 30.3 % — ABNORMAL LOW (ref 36.0–46.0)
HEMOGLOBIN: 10.4 g/dL — AB (ref 12.0–15.0)
LYMPHS ABS: 2.3 10*3/uL (ref 0.7–4.0)
Lymphocytes Relative: 43 %
MCH: 30.8 pg (ref 26.0–34.0)
MCHC: 34.3 g/dL (ref 30.0–36.0)
MCV: 89.6 fL (ref 78.0–100.0)
MONO ABS: 0.6 10*3/uL (ref 0.1–1.0)
MONOS PCT: 11 %
Neutro Abs: 2.4 10*3/uL (ref 1.7–7.7)
Neutrophils Relative %: 44 %
PLATELETS: 247 10*3/uL (ref 150–400)
RBC: 3.38 MIL/uL — ABNORMAL LOW (ref 3.87–5.11)
RDW: 12.7 % (ref 11.5–15.5)
WBC: 5.4 10*3/uL (ref 4.0–10.5)

## 2017-04-07 LAB — TROPONIN I

## 2017-04-07 NOTE — ED Provider Notes (Signed)
[facility] Provider Note: Lowella Dell, MD, FACEP  CSN: [id number] MRN: [id number] ARRIVAL: 04/06/17 at 2301 ROOM: [facility]08/[facility]08   CHIEF COMPLAINT  Hypertension   HISTORY OF PRESENT ILLNESS  Carmen Mann is a 46 y.o. female with a history of hypertension. She is here with a 2 day history of headache which was a 6 out of 10. She took her blood pressure multiple times yesterday and found it to be as high as 237/118. It was 177/98 on arrival. On my violation her blood pressure is 136/83. She denies current chest pain, shortness of breath, swelling, numbness, weakness or blurred vision. She has been compliant with her antihypertensives. She acknowledges "a little bit of chest pain" yesterday which is no longer present.   Past Medical History:  Diagnosis Date  . History of positive PPD, untreated   . Hyperlipidemia   . Hypertension   . 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 CESAREAN SECTION    . TUBAL LIGATION      Family History  Problem Relation Age of Onset  . Hypertension Mother   . Prostate cancer Father   . Heart disease      Social History  Substance Use Topics  . Smoking status: Never Smoker  . Smokeless tobacco: Never Used  . Alcohol use No    Prior to Admission medications   Medication Sig Start Date End Date Taking? Authorizing Provider  hydrochlorothiazide (HYDRODIURIL) 25 MG tablet Take 25 mg by mouth daily.   Yes [provider]    Allergies Patient has no known allergies.   REVIEW OF SYSTEMS  Negative except as noted here or in the History of Present Illness.   PHYSICAL EXAMINATION  Initial Vital Signs Blood pressure 136/83, pulse 85, temperature 98.3 F (36.8 C), temperature source Oral, resp. rate 18, height 5\' 5"  (1.651 m), weight 195 lb (88.5 kg), last menstrual period 03/23/2017, SpO2 100 %.  Examination General: Well-developed, well-nourished female in no acute  distress; appearance consistent with age of record HENT: normocephalic; atraumatic Eyes: pupils equal, round and reactive to light; extraocular muscles intact Neck: supple Heart: regular rate and rhythm Lungs: clear to auscultation bilaterally Abdomen: soft; nondistended; nontender; bowel sounds present Extremities: No deformity; full range of motion; pulses normal Neurologic: Awake, alert and oriented; motor function intact in all extremities and symmetric; no facial droop Skin: Warm and dry Psychiatric: Normal mood and affect   RESULTS  Summary of this visit's results, reviewed by myself:   EKG Interpretation  Date/Time:  Tuesday Apr 06 2017 23:46:03 EDT Ventricular Rate:  68 PR Interval:    QRS Duration: 98 QT Interval:  406 QTC Calculation: 432 R Axis:   60 Text Interpretation:  Sinus rhythm Consider left atrial enlargement Probable left ventricular hypertrophy No previous ECGs available Confirmed by Raenette Sakata  MD, Jonny Ruiz ([id number]) on 04/06/2017 11:56:02 PM      Laboratory Studies: Results for orders placed or performed during the hospital encounter of 04/06/17 (from the past 24 hour(s))  CBC with Differential/Platelet     Status: Abnormal   Collection Time: 04/07/17  1:07 AM  Result Value Ref Range   WBC 5.4 4.0 - 10.5 K/uL   RBC 3.38 (L) 3.87 - 5.11 MIL/uL   Hemoglobin 10.4 (L) 12.0 - 15.0 g/dL   HCT 84.1 (L) 32.4 - 40.1 %   MCV 89.6 78.0 - 100.0 fL   MCH 30.8 26.0 -  34.0 pg   MCHC 34.3 30.0 - 36.0 g/dL   RDW 04.512.7 40.911.5 - 81.115.5 %   Platelets 247 150 - 400 K/uL   Neutrophils Relative % 44 %   Neutro Abs 2.4 1.7 - 7.7 K/uL   Lymphocytes Relative 43 %   Lymphs Abs 2.3 0.7 - 4.0 K/uL   Monocytes Relative 11 %   Monocytes Absolute 0.6 0.1 - 1.0 K/uL   Eosinophils Relative 2 %   Eosinophils Absolute 0.1 0.0 - 0.7 K/uL   Basophils Relative 0 %   Basophils Absolute 0.0 0.0 - 0.1 K/uL  Basic metabolic panel     Status: Abnormal   Collection Time: 04/07/17  1:07 AM  Result  Value Ref Range   Sodium 136 135 - 145 mmol/L   Potassium 3.5 3.5 - 5.1 mmol/L   Chloride 108 101 - 111 mmol/L   CO2 24 22 - 32 mmol/L   Glucose, Bld 113 (H) 65 - 99 mg/dL   BUN 15 6 - 20 mg/dL   Creatinine, Ser 9.140.74 0.44 - 1.00 mg/dL   Calcium 8.4 (L) 8.9 - 10.3 mg/dL   GFR calc non Af Amer >60 >60 mL/min   GFR calc Af Amer >60 >60 mL/min   Anion gap 4 (L) 5 - 15  Troponin I     Status: None   Collection Time: 04/07/17  1:07 AM  Result Value Ref Range   Troponin I <0.03 <0.03 ng/mL   Imaging Studies: No results found.  ED COURSE  Nursing notes and initial vitals signs, including pulse oximetry, reviewed.  Vitals:   04/06/17 2307 04/07/17 0021  BP: (!) 177/98 136/83  Pulse: 85   Resp: 18   Temp: 98.3 F (36.8 C)   TempSrc: Oral   SpO2: 100%   Weight: 195 lb (88.5 kg)   Height: 5\' 5"  (1.651 m)     PROCEDURES    ED DIAGNOSES     ICD-9-CM ICD-10-CM   1. Elevated blood pressure reading 796.2 R03.0        Bricia Taher, MD 04/07/17 [phone]

## 2017-12-27 ENCOUNTER — Encounter (HOSPITAL_BASED_OUTPATIENT_CLINIC_OR_DEPARTMENT_OTHER): Payer: Self-pay | Admitting: Respiratory Therapy

## 2017-12-27 ENCOUNTER — Emergency Department (HOSPITAL_BASED_OUTPATIENT_CLINIC_OR_DEPARTMENT_OTHER)
Admission: EM | Admit: 2017-12-27 | Discharge: 2017-12-27 | Disposition: A | Discharge status: Home / Self Care | Payer: BLUE CROSS/BLUE SHIELD | Attending: Emergency Medicine | Admitting: Emergency Medicine

## 2017-12-27 DIAGNOSIS — M545 Low back pain: Secondary | ICD-10-CM | POA: Diagnosis not present

## 2017-12-27 DIAGNOSIS — I10 Essential (primary) hypertension: Secondary | ICD-10-CM | POA: Insufficient documentation

## 2017-12-27 DIAGNOSIS — R109 Unspecified abdominal pain: Secondary | ICD-10-CM | POA: Diagnosis present

## 2017-12-27 LAB — URINALYSIS, ROUTINE W REFLEX MICROSCOPIC
Bilirubin Urine: NEGATIVE
GLUCOSE, UA: NEGATIVE mg/dL
Hgb urine dipstick: NEGATIVE
KETONES UR: NEGATIVE mg/dL
LEUKOCYTES UA: NEGATIVE
NITRITE: NEGATIVE
Protein, ur: NEGATIVE mg/dL
Specific Gravity, Urine: >1.03 — ABNORMAL HIGH (ref 1.005–1.030)
pH: 5.5 (ref 5.0–8.0)

## 2017-12-27 LAB — CBC WITH DIFFERENTIAL/PLATELET
Basophils Absolute: 0 10*3/uL (ref 0.0–0.1)
Basophils Relative: 0 %
EOS ABS: 0.1 10*3/uL (ref 0.0–0.7)
EOS PCT: 2 %
HCT: 33.1 % — ABNORMAL LOW (ref 36.0–46.0)
Hemoglobin: 11.2 g/dL — ABNORMAL LOW (ref 12.0–15.0)
LYMPHS PCT: 41 %
Lymphs Abs: 2.4 10*3/uL (ref 0.7–4.0)
MCH: 30 pg (ref 26.0–34.0)
MCHC: 33.8 g/dL (ref 30.0–36.0)
MCV: 88.7 fL (ref 78.0–100.0)
MONO ABS: 0.5 10*3/uL (ref 0.1–1.0)
Monocytes Relative: 8 %
Neutro Abs: 2.7 10*3/uL (ref 1.7–7.7)
Neutrophils Relative %: 49 %
Platelets: 290 10*3/uL (ref 150–400)
RBC: 3.73 MIL/uL — ABNORMAL LOW (ref 3.87–5.11)
RDW: 12.2 % (ref 11.5–15.5)
WBC: 5.7 10*3/uL (ref 4.0–10.5)

## 2017-12-27 LAB — BASIC METABOLIC PANEL
Anion gap: 8 (ref 5–15)
BUN: 16 mg/dL (ref 6–20)
CALCIUM: 8.7 mg/dL — AB (ref 8.9–10.3)
CO2: 24 mmol/L (ref 22–32)
Chloride: 107 mmol/L (ref 101–111)
Creatinine, Ser: 0.76 mg/dL (ref 0.44–1.00)
GLUCOSE: 95 mg/dL (ref 65–99)
POTASSIUM: 3.7 mmol/L (ref 3.5–5.1)
SODIUM: 139 mmol/L (ref 135–145)

## 2017-12-27 MED ORDER — CYCLOBENZAPRINE HCL 5 MG PO TABS
5.0000 mg | ORAL_TABLET | Freq: Once | ORAL | Status: AC
  Administered 2017-12-27: 5 mg via ORAL
  Filled 2017-12-27: qty 1

## 2017-12-27 MED ORDER — CYCLOBENZAPRINE HCL 5 MG PO TABS: 5.0000 mg | ORAL_TABLET | Freq: Two times a day (BID) | ORAL | 0 refills | Status: DC | PRN

## 2017-12-27 MED ORDER — NAPROXEN 250 MG PO TABS
500.0000 mg | ORAL_TABLET | Freq: Once | ORAL | Status: AC
  Administered 2017-12-27: 500 mg via ORAL
  Filled 2017-12-27: qty 2

## 2017-12-27 MED ORDER — NAPROXEN 500 MG PO TABS: 500.0000 mg | ORAL_TABLET | Freq: Two times a day (BID) | ORAL | 0 refills | Status: DC

## 2017-12-27 MED ORDER — KETOROLAC TROMETHAMINE 30 MG/ML IJ SOLN
30.0000 mg | Freq: Once | INTRAMUSCULAR | Status: AC
  Administered 2017-12-27: 30 mg via INTRAMUSCULAR
  Filled 2017-12-27: qty 1

## 2017-12-27 NOTE — ED Provider Notes (Signed)
[facility] EMERGENCY DEPARTMENT Provider Note   CSN: [id number] Arrival date & time: 12/27/17  0215     History   Chief Complaint Chief Complaint  Patient presents with  . Flank Pain    HPI Carmen Mann is a 47 y.o. female.  HPI  This is a 47 year old female with a history of hypertension, hyperlipidemia who presents with right flank pain.  Onset of symptoms 2 days ago.  She states that it started when she bent to pick something up.  Pain is worse with certain movements.  Denies dysuria, hematuria.  Patient does state that the pain radiates downward into her buttock.  Denies any bowel or bladder difficulties.  Denies any nausea, vomiting, abdominal pain, diarrhea.  She has not taken anything for her symptoms.  Currently she rates her symptoms at 10 out of 10.  She denies any rash.  Past Medical History:  Diagnosis Date  . History of positive PPD, untreated   . Hyperlipidemia   . Hypertension   . 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 Globus sensation 02/02/2014  . Weight gain 02/02/2014  . Enuresis 10/13/2013  . Mastalgia 10/13/2013  . HTN (hypertension) 10/13/2013  . 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 Sciatica of left side 10/13/2013    Past Surgical History:  Procedure Laterality Date  . CESAREAN SECTION    . TUBAL LIGATION      OB History    No data available       Home Medications    Prior to Admission medications   Medication Sig Start Date End Date Taking? Authorizing Provider  cyclobenzaprine (FLEXERIL) 5 MG tablet Take 1 tablet (5 mg total) by mouth 2 (two) times daily as needed for muscle spasms. 12/27/17   Winthrop Shannahan, Mayer Masker, MD  hydrochlorothiazide (HYDRODIURIL) 25 MG tablet Take 25 mg by mouth daily.    [provider]  naproxen (NAPROSYN) 500 MG tablet Take 1 tablet (500 mg total) by mouth 2 (two) times daily. 12/27/17   Finnley Larusso, Mayer Masker, MD    Family History Family History    Problem Relation Age of Onset  . Hypertension Mother   . Prostate cancer Father   . Heart disease Unknown     Social History Social History   Tobacco Use  . Smoking status: Never Smoker  . Smokeless tobacco: Never Used  Substance Use Topics  . Alcohol use: No  . Drug use: No     Allergies   Patient has no known allergies.   Review of Systems Review of Systems  Constitutional: Negative for fever.  Respiratory: Negative for shortness of breath.   Cardiovascular: Negative for chest pain.  Gastrointestinal: Negative for abdominal pain, nausea and vomiting.  Genitourinary: Positive for flank pain. Negative for dysuria and hematuria.  Musculoskeletal: Positive for back pain.  Neurological: Negative for weakness and numbness.  All other systems reviewed and are negative.    Physical Exam Updated Vital Signs BP (!) 155/91 (BP Location: Right Arm)   Pulse 68   Temp 98.6 F (37 C) (Oral)   SpO2 100%   Physical Exam  Constitutional: She is oriented to person, place, and time. She appears well-developed and well-nourished.  Overweight, no acute distress  HENT:  Head: Normocephalic and atraumatic.  Cardiovascular: Normal rate, regular rhythm and normal heart sounds.  Pulmonary/Chest: Effort normal and breath sounds normal. No respiratory distress. She has no wheezes.  Abdominal: Soft. Bowel sounds are normal.  Musculoskeletal:  Tenderness palpation right paraspinous muscle region of the lumbar spine, no step-off or deformity or midline tenderness to palpation, positive straight leg raise on the right  Neurological: She is alert and oriented to person, place, and time.  5 out of 5 strength bilateral lower extremities, normal reflexes bilaterally, no clonus  Skin: Skin is warm and dry. No rash noted.  Psychiatric: She has a normal mood and affect.  Nursing note and vitals reviewed.    ED Treatments / Results  Labs (all labs ordered are listed, but only abnormal  results are displayed) Labs Reviewed  CBC WITH DIFFERENTIAL/PLATELET - Abnormal; Notable for the following components:      Result Value   RBC 3.73 (*)    Hemoglobin 11.2 (*)    HCT 33.1 (*)    All other components within normal limits  BASIC METABOLIC PANEL - Abnormal; Notable for the following components:   Calcium 8.7 (*)    All other components within normal limits  URINALYSIS, ROUTINE W REFLEX MICROSCOPIC - Abnormal; Notable for the following components:   Specific Gravity, Urine >1.030 (*)    All other components within normal limits    EKG  EKG Interpretation None       Radiology No results found.  Procedures Procedures (including critical care time)  Medications Ordered in ED Medications  ketorolac (TORADOL) 30 MG/ML injection 30 mg (30 mg Intramuscular Given 12/27/17 0422)     Initial Impression / Assessment and Plan / ED Course  I have reviewed the triage vital signs and the nursing notes.  Pertinent labs & imaging results that were available during my care of the patient were reviewed by me and considered in my medical decision making (see chart for details).     With right-sided back pain/flank pain.  She is nontoxic appearing on exam.  Vital signs are reassuring.  No signs or symptoms of cauda equina.  Some features suggestive of musculoskeletal etiology.  She is neuro intact.  Denies urinary symptoms.  No rash suggestive of shingles.  Lab work is largely reassuring including urinalysis.  Patient improved with Toradol.  Recommend anti-inflammatory medications and Flexeril as needed.  Follow-up with primary physician.    After history, exam, and medical workup I feel the patient has been appropriately medically screened and is safe for discharge home. Pertinent diagnoses were discussed with the patient. Patient was given return precautions.  Final Clinical Impressions(s) / ED Diagnoses   Final diagnoses:  Acute right-sided low back pain, with sciatica  presence unspecified    ED Discharge Orders        Ordered    naproxen (NAPROSYN) 500 MG tablet  2 times daily     12/27/17 0442    cyclobenzaprine (FLEXERIL) 5 MG tablet  2 times daily PRN     12/27/17 0442       Shon BatonHorton, Brooklee Michelin F, MD 12/27/17 [phone]

## 2017-12-27 NOTE — ED Triage Notes (Signed)
EDP into room prior to triage. EDP into room, prior to RN assessment, see MD notes, orders received and initiated.    Alert, NAD, calm, interactive, resps e/u, speaking in clear complete sentences, no dyspnea noted, skin W&D, VSS, c/o flank pain. [facility]  Attempted venipuncture/IV, unsuccessful, poor flow.

## 2018-04-07 ENCOUNTER — Other Ambulatory Visit: Payer: Self-pay | Admitting: Family

## 2018-04-07 DIAGNOSIS — Z1231 Encounter for screening mammogram for malignant neoplasm of breast: Secondary | ICD-10-CM

## 2018-04-26 ENCOUNTER — Encounter (INDEPENDENT_AMBULATORY_CARE_PROVIDER_SITE_OTHER): Payer: Self-pay

## 2018-04-26 ENCOUNTER — Ambulatory Visit
Admission: RE | Admit: 2018-04-26 | Discharge: 2018-04-26 | Disposition: A | Discharge status: Home / Self Care | Payer: BLUE CROSS/BLUE SHIELD | Source: Ambulatory Visit | Attending: Family | Admitting: Family

## 2018-04-26 DIAGNOSIS — Z1231 Encounter for screening mammogram for malignant neoplasm of breast: Secondary | ICD-10-CM

## 2019-06-26 ENCOUNTER — Other Ambulatory Visit: Payer: Self-pay | Admitting: Family

## 2019-06-26 DIAGNOSIS — Z1231 Encounter for screening mammogram for malignant neoplasm of breast: Secondary | ICD-10-CM

## 2019-09-15 ENCOUNTER — Telehealth (HOSPITAL_COMMUNITY): Payer: Self-pay

## 2019-09-15 NOTE — Telephone Encounter (Signed)
Telephoned patient at home. Left a message with the person answering. Return call to schedule with the mammography scholarship fund.

## 2019-10-24 ENCOUNTER — Other Ambulatory Visit: Payer: Self-pay

## 2019-10-24 ENCOUNTER — Ambulatory Visit
Admission: RE | Admit: 2019-10-24 | Discharge: 2019-10-24 | Disposition: A | Discharge status: Home / Self Care | Payer: BLUE CROSS/BLUE SHIELD | Source: Ambulatory Visit | Attending: Family | Admitting: Family

## 2019-10-24 ENCOUNTER — Other Ambulatory Visit: Payer: Self-pay | Admitting: Family

## 2019-10-24 DIAGNOSIS — Z1231 Encounter for screening mammogram for malignant neoplasm of breast: Secondary | ICD-10-CM

## 2019-10-24 DIAGNOSIS — N63 Unspecified lump in unspecified breast: Secondary | ICD-10-CM

## 2019-10-24 DIAGNOSIS — N644 Mastodynia: Secondary | ICD-10-CM

## 2020-01-25 ENCOUNTER — Encounter: Payer: Self-pay | Admitting: Adult Health Nurse Practitioner

## 2020-01-25 ENCOUNTER — Other Ambulatory Visit: Payer: Self-pay

## 2020-01-25 ENCOUNTER — Ambulatory Visit: Payer: 59 | Admitting: Adult Health Nurse Practitioner

## 2020-01-25 VITALS — BP 154/100 | HR 90 | Temp 98.1°F | Ht 65.0 in | Wt 208.2 lb

## 2020-01-25 DIAGNOSIS — I10 Essential (primary) hypertension: Secondary | ICD-10-CM | POA: Diagnosis not present

## 2020-01-25 DIAGNOSIS — Z23 Encounter for immunization: Secondary | ICD-10-CM | POA: Diagnosis not present

## 2020-01-25 DIAGNOSIS — R2231 Localized swelling, mass and lump, right upper limb: Secondary | ICD-10-CM

## 2020-01-25 DIAGNOSIS — Z01419 Encounter for gynecological examination (general) (routine) without abnormal findings: Secondary | ICD-10-CM | POA: Diagnosis not present

## 2020-01-25 DIAGNOSIS — F332 Major depressive disorder, recurrent severe without psychotic features: Secondary | ICD-10-CM

## 2020-01-25 DIAGNOSIS — N6459 Other signs and symptoms in breast: Secondary | ICD-10-CM

## 2020-01-25 DIAGNOSIS — R5383 Other fatigue: Secondary | ICD-10-CM

## 2020-01-25 LAB — POCT URINALYSIS DIP (MANUAL ENTRY)
Bilirubin, UA: NEGATIVE
Blood, UA: NEGATIVE
Glucose, UA: NEGATIVE mg/dL
Ketones, POC UA: NEGATIVE mg/dL
Nitrite, UA: NEGATIVE
Protein Ur, POC: NEGATIVE mg/dL
Spec Grav, UA: >=1.03 — AB (ref 1.010–1.025)
Urobilinogen, UA: 0.2 E.U./dL
pH, UA: 5.5 (ref 5.0–8.0)

## 2020-01-25 MED ORDER — ALBUTEROL SULFATE HFA 108 (90 BASE) MCG/ACT IN AERS: 2.0000 | INHALATION_SPRAY | Freq: Four times a day (QID) | RESPIRATORY_TRACT | 0 refills | Status: DC | PRN

## 2020-01-25 MED ORDER — LISINOPRIL-HYDROCHLOROTHIAZIDE 20-25 MG PO TABS: 1.0000 | ORAL_TABLET | Freq: Every day | ORAL | 3 refills | Status: AC

## 2020-01-25 NOTE — Patient Instructions (Addendum)
   If you have lab work done today you will be contacted with your lab results within the next 2 weeks.  If you have not heard from us then please contact us. The fastest way to get your results is to register for My Chart.   IF you received an x-ray today, you will receive an invoice from [facility]. Please contact [facility] at [phone] with questions or concerns regarding your invoice.   IF you received labwork today, you will receive an invoice from [facility]. Please contact [facility] at [phone] with questions or concerns regarding your invoice.   Our billing staff will not be able to assist you with questions regarding bills from these companies.  You will be contacted with the lab results as soon as they are available. The fastest way to get your results is to activate your My Chart account. Instructions are located on the last page of this paperwork. If you have not heard from us regarding the results in 2 weeks, please contact this office.     Persistent Depressive Disorder  Persistent depressive disorder (PDD) is a mental health condition. PDD causes symptoms of low-level depression for 2 years or longer. It may also be called long-term (chronic) depression or dysthymia. PDD may include episodes of more severe depression that last for about 2 weeks (major depressive disorder or MDD). PDD can affect the way you think, feel, and sleep. This condition may also affect your relationships. You may be more likely to get sick if you have PDD. Symptoms of PDD occur for most of the day and may include:  Feeling tired (fatigue).  Low energy.  Eating too much or too little.  Sleeping too much or too little.  Feeling restless or agitated.  Feeling hopeless.  Feeling worthless or guilty.  Feeling worried or nervous (anxiety).  Trouble concentrating or making decisions.  Low self-esteem.  A negative way of looking at things ([place]).  Not being able  to have fun or feel pleasure.  Avoiding interacting with people.  Getting angry or annoyed easily (irritability).  Acting aggressive or angry. Follow these instructions at home: Activity  Go back to your normal activities as told by your doctor.  Exercise regularly as told by your doctor. General instructions  Take over-the-counter and prescription medicines only as told by your doctor.  Do not drink alcohol. Or, limit how much alcohol you drink to no more than 1 drink a day for nonpregnant women and 2 drinks a day for men. One drink equals 12 oz of beer, 5 oz of wine, or 1 oz of hard liquor. Alcohol can affect any antidepressant medicines you are taking. Talk with your doctor about your alcohol use.  Eat a healthy diet and get plenty of sleep.  Find activities that you enjoy each day.  Consider joining a support group. Your doctor may be able to suggest a support group.  Keep all follow-up visits as told by your doctor. This is important. Where to find more information National Alliance on Mental Illness  www.nami.org [place] National Institute of Mental Health  www.nimh.nih.gov National Suicide Prevention Lifeline  (1-800-273-8255).  This is free, 24-hour help. Contact a doctor if:  Your symptoms get worse.  You have new symptoms.  You have trouble sleeping or doing your daily activities. Get help right away if:  You self-harm.  You have serious thoughts about hurting yourself or others.  You see, hear, taste, smell, or feel things that are not there (  hallucinate). This information is not intended to replace advice given to you by your health care provider. Make sure you discuss any questions you have with your health care provider. Document Revised: 10/29/2017 Document Reviewed: 07/10/2016 Elsevier Patient Education  2020 [employer].  Managing Your Hypertension Hypertension is commonly called high blood pressure. This is when the force of your blood  pressing against the walls of your arteries is too strong. Arteries are blood vessels that carry blood from your heart throughout your body. Hypertension forces the heart to work harder to pump blood, and may cause the arteries to become narrow or stiff. Having untreated or uncontrolled hypertension can cause heart attack, stroke, kidney disease, and other problems. What are blood pressure readings? A blood pressure reading consists of a higher number over a lower number. Ideally, your blood pressure should be below 120/80. The first ("top") number is called the systolic pressure. It is a measure of the pressure in your arteries as your heart beats. The second ("bottom") number is called the diastolic pressure. It is a measure of the pressure in your arteries as the heart relaxes. What does my blood pressure reading mean? Blood pressure is classified into four stages. Based on your blood pressure reading, your health care provider may use the following stages to determine what type of treatment you need, if any. Systolic pressure and diastolic pressure are measured in a unit called mm Hg. Normal  Systolic pressure: below 120.  Diastolic pressure: below 80. Elevated  Systolic pressure: 120-129.  Diastolic pressure: below 80. Hypertension stage 1  Systolic pressure: 130-139.  Diastolic pressure: 80-89. Hypertension stage 2  Systolic pressure: 140 or above.  Diastolic pressure: 90 or above. What health risks are associated with hypertension? Managing your hypertension is an important responsibility. Uncontrolled hypertension can lead to:  A heart attack.  A stroke.  A weakened blood vessel (aneurysm).  Heart failure.  Kidney damage.  Eye damage.  Metabolic syndrome.  Memory and concentration problems. What changes can I make to manage my hypertension? Hypertension can be managed by making lifestyle changes and possibly by taking medicines. Your health care provider will help  you make a plan to bring your blood pressure within a normal range. Eating and drinking   Eat a diet that is high in fiber and potassium, and low in salt (sodium), added sugar, and fat. An example eating plan is called the DASH (Dietary Approaches to Stop Hypertension) diet. To eat this way: ? Eat plenty of fresh fruits and vegetables. Try to fill half of your plate at each meal with fruits and vegetables. ? Eat whole grains, such as whole wheat pasta, brown rice, or whole grain bread. Fill about one quarter of your plate with whole grains. ? Eat low-fat diary products. ? Avoid fatty cuts of meat, processed or cured meats, and poultry with skin. Fill about one quarter of your plate with lean proteins such as fish, chicken without skin, beans, eggs, and tofu. ? Avoid premade and processed foods. These tend to be higher in sodium, added sugar, and fat.  Reduce your daily sodium intake. Most people with hypertension should eat less than 1,500 mg of sodium a day.  Limit alcohol intake to no more than 1 drink a day for nonpregnant women and 2 drinks a day for men. One drink equals 12 oz of beer, 5 oz of wine, or 1 oz of hard liquor. Lifestyle  Work with your health care provider to maintain a healthy body  weight, or to lose weight. Ask what an ideal weight is for you.  Get at least 30 minutes of exercise that causes your heart to beat faster (aerobic exercise) most days of the week. Activities may include walking, swimming, or biking.  Include exercise to strengthen your muscles (resistance exercise), such as weight lifting, as part of your weekly exercise routine. Try to do these types of exercises for 30 minutes at least 3 days a week.  Do not use any products that contain nicotine or tobacco, such as cigarettes and e-cigarettes. If you need help quitting, ask your health care provider.  Control any long-term (chronic) conditions you have, such as high cholesterol or  diabetes. Monitoring  Monitor your blood pressure at home as told by your health care provider. Your personal target blood pressure may vary depending on your medical conditions, your age, and other factors.  Have your blood pressure checked regularly, as often as told by your health care provider. Working with your health care provider  Review all the medicines you take with your health care provider because there may be side effects or interactions.  Talk with your health care provider about your diet, exercise habits, and other lifestyle factors that may be contributing to hypertension.  Visit your health care provider regularly. Your health care provider can help you create and adjust your plan for managing hypertension. Will I need medicine to control my blood pressure? Your health care provider may prescribe medicine if lifestyle changes are not enough to get your blood pressure under control, and if:  Your systolic blood pressure is 130 or higher.  Your diastolic blood pressure is 80 or higher. Take medicines only as told by your health care provider. Follow the directions carefully. Blood pressure medicines must be taken as prescribed. The medicine does not work as well when you skip doses. Skipping doses also puts you at risk for problems. Contact a health care provider if:  You think you are having a reaction to medicines you have taken.  You have repeated (recurrent) headaches.  You feel dizzy.  You have swelling in your ankles.  You have trouble with your vision. Get help right away if:  You develop a severe headache or confusion.  You have unusual weakness or numbness, or you feel faint.  You have severe pain in your chest or abdomen.  You vomit repeatedly.  You have trouble breathing. Summary  Hypertension is when the force of blood pumping through your arteries is too strong. If this condition is not controlled, it may put you at risk for serious  complications.  Your personal target blood pressure may vary depending on your medical conditions, your age, and other factors. For most people, a normal blood pressure is less than 120/80.  Hypertension is managed by lifestyle changes, medicines, or both. Lifestyle changes include weight loss, eating a healthy, low-sodium diet, exercising more, and limiting alcohol. This information is not intended to replace advice given to you by your health care provider. Make sure you discuss any questions you have with your health care provider. Document Revised: 03/10/2019 Document Reviewed: 10/14/2016 Elsevier Patient Education  [place].  Managing Your Hypertension Hypertension is commonly called high blood pressure. This is when the force of your blood pressing against the walls of your arteries is too strong. Arteries are blood vessels that carry blood from your heart throughout your body. Hypertension forces the heart to work harder to pump blood, and may cause the arteries to  become narrow or stiff. Having untreated or uncontrolled hypertension can cause heart attack, stroke, kidney disease, and other problems. What are blood pressure readings? A blood pressure reading consists of a higher number over a lower number. Ideally, your blood pressure should be below 120/80. The first ("top") number is called the systolic pressure. It is a measure of the pressure in your arteries as your heart beats. The second ("bottom") number is called the diastolic pressure. It is a measure of the pressure in your arteries as the heart relaxes. What does my blood pressure reading mean? Blood pressure is classified into four stages. Based on your blood pressure reading, your health care provider may use the following stages to determine what type of treatment you need, if any. Systolic pressure and diastolic pressure are measured in a unit called mm Hg. Normal  Systolic pressure: below 120.  Diastolic pressure:  below 80. Elevated  Systolic pressure: 120-129.  Diastolic pressure: below 80. Hypertension stage 1  Systolic pressure: 130-139.  Diastolic pressure: 80-89. Hypertension stage 2  Systolic pressure: 140 or above.  Diastolic pressure: 90 or above. What health risks are associated with hypertension? Managing your hypertension is an important responsibility. Uncontrolled hypertension can lead to:  A heart attack.  A stroke.  A weakened blood vessel (aneurysm).  Heart failure.  Kidney damage.  Eye damage.  Metabolic syndrome.  Memory and concentration problems. What changes can I make to manage my hypertension? Hypertension can be managed by making lifestyle changes and possibly by taking medicines. Your health care provider will help you make a plan to bring your blood pressure within a normal range. Eating and drinking   Eat a diet that is high in fiber and potassium, and low in salt (sodium), added sugar, and fat. An example eating plan is called the DASH (Dietary Approaches to Stop Hypertension) diet. To eat this way: ? Eat plenty of fresh fruits and vegetables. Try to fill half of your plate at each meal with fruits and vegetables. ? Eat whole grains, such as whole wheat pasta, brown rice, or whole grain bread. Fill about one quarter of your plate with whole grains. ? Eat low-fat diary products. ? Avoid fatty cuts of meat, processed or cured meats, and poultry with skin. Fill about one quarter of your plate with lean proteins such as fish, chicken without skin, beans, eggs, and tofu. ? Avoid premade and processed foods. These tend to be higher in sodium, added sugar, and fat.  Reduce your daily sodium intake. Most people with hypertension should eat less than 1,500 mg of sodium a day.  Limit alcohol intake to no more than 1 drink a day for nonpregnant women and 2 drinks a day for men. One drink equals 12 oz of beer, 5 oz of wine, or 1 oz of hard  liquor. Lifestyle  Work with your health care provider to maintain a healthy body weight, or to lose weight. Ask what an ideal weight is for you.  Get at least 30 minutes of exercise that causes your heart to beat faster (aerobic exercise) most days of the week. Activities may include walking, swimming, or biking.  Include exercise to strengthen your muscles (resistance exercise), such as weight lifting, as part of your weekly exercise routine. Try to do these types of exercises for 30 minutes at least 3 days a week.  Do not use any products that contain nicotine or tobacco, such as cigarettes and e-cigarettes. If you need help quitting, ask your  health care provider.  Control any long-term (chronic) conditions you have, such as high cholesterol or diabetes. Monitoring  Monitor your blood pressure at home as told by your health care provider. Your personal target blood pressure may vary depending on your medical conditions, your age, and other factors.  Have your blood pressure checked regularly, as often as told by your health care provider. Working with your health care provider  Review all the medicines you take with your health care provider because there may be side effects or interactions.  Talk with your health care provider about your diet, exercise habits, and other lifestyle factors that may be contributing to hypertension.  Visit your health care provider regularly. Your health care provider can help you create and adjust your plan for managing hypertension. Will I need medicine to control my blood pressure? Your health care provider may prescribe medicine if lifestyle changes are not enough to get your blood pressure under control, and if:  Your systolic blood pressure is 130 or higher.  Your diastolic blood pressure is 80 or higher. Take medicines only as told by your health care provider. Follow the directions carefully. Blood pressure medicines must be taken as  prescribed. The medicine does not work as well when you skip doses. Skipping doses also puts you at risk for problems. Contact a health care provider if:  You think you are having a reaction to medicines you have taken.  You have repeated (recurrent) headaches.  You feel dizzy.  You have swelling in your ankles.  You have trouble with your vision. Get help right away if:  You develop a severe headache or confusion.  You have unusual weakness or numbness, or you feel faint.  You have severe pain in your chest or abdomen.  You vomit repeatedly.  You have trouble breathing. Summary  Hypertension is when the force of blood pumping through your arteries is too strong. If this condition is not controlled, it may put you at risk for serious complications.  Your personal target blood pressure may vary depending on your medical conditions, your age, and other factors. For most people, a normal blood pressure is less than 120/80.  Hypertension is managed by lifestyle changes, medicines, or both. Lifestyle changes include weight loss, eating a healthy, low-sodium diet, exercising more, and limiting alcohol. This information is not intended to replace advice given to you by your health care provider. Make sure you discuss any questions you have with your health care provider. Document Revised: 03/10/2019 Document Reviewed: 10/14/2016 Elsevier Patient Education  2020 [employer].

## 2020-01-25 NOTE — Progress Notes (Signed)
Chief Complaint  Patient presents with  . 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  17 days ago she had a positive Covid.  She quarantined at home but is still feeling fatigue and generalized weakness.  She is back at work.   Problems today include a lump under her right arm she has noticed over the past few weeks.  She describes it as mildly tender and new.  Has not noticed it before.  No hx of breast cancer in self or family that she is aware.  Her mom had Hypertension, aunt with an aneurysm who is still living though recently had a stroke which is how she was diagnosed.   Describes menses as mostly normal, moderate amount of bleeding.  She endorses symptoms of getting cold or chilled easily   She has been feeling some anxiety and depression primarily due to her occupation during [place]. No thoughts of hurting self or others.  She is not at the point of wanting to start medications though she will consider.     In addition, BP has been elevated.  She knows she needs to treat.  She has been taking it intermittently with elevated values.  Today it is 154/100.   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  Medications    Current Outpatient Medications:  .  cyclobenzaprine (FLEXERIL) 5 MG tablet, Take 1 tablet (5 mg total) by mouth 2 (two) times daily as needed for muscle spasms. (Patient not taking: Reported on 01/25/2020), Disp: 10 tablet, Rfl: 0 .  hydrochlorothiazide (HYDRODIURIL) 25 MG tablet, Take 25 mg by mouth daily., Disp: , Rfl:  .  naproxen (NAPROSYN) 500 MG tablet, Take 1 tablet (500 mg total) by mouth 2 (two) times daily. (Patient not taking: Reported on 01/25/2020), Disp: 30 tablet, Rfl: 0    Review of Systems    Constitutional: Negative for activity change, appetite change, chills and fever.  HENT: Negative for congestion, nosebleeds, trouble swallowing and voice change.   Respiratory: Negative for cough, shortness of breath and wheezing.   Cardiac:  Negative for chest pain, pressure, syncope  Gastrointestinal: Negative for diarrhea, nausea and vomiting.  Genitourinary: Negative for difficulty urinating, dysuria, flank pain and hematuria.  Musculoskeletal: Negative for back pain, joint swelling and neck pain.  Neurological: Negative for dizziness, speech difficulty, light-headedness and numbness. Marland Kitchen Psychological ROS: positive for - anxiety, depression and sleep disturbances negative for - irritability, obsessive thoughts or suicidal ideation  See HPI. All other review of systems negative.     Physical Exam:    height is 5' 5"  (1.651 m) and weight is 208 lb 3.2 oz (94.4 kg). Her temporal temperature is 98.1 F (36.7 C). Her blood pressure is 154/100 (abnormal) and her pulse is 90. Her oxygen saturation is 99%.   Physical Examination: General appearance - alert, well appearing, and in no distress and oriented to person, place, and time Mental status - normal mood, behavior, speech, dress, motor activity, and thought processes Eyes - PERRL. Extraocular movements intact.  No nystagmus.  Neck - supple, no significant adenopathy, carotids upstroke normal bilaterally, no bruits, thyroid exam: thyroid is normal in size without nodules or tenderness Breast exam: left  inner quadrant at 3 o'clock there is a palpable mass which is movable, non-fixed, non-tender, on the right she has a harder, nodular mass adjacent to the axilla, approximately the size of a quarter, non-tender. . Chest - clear to auscultation, no wheezes, rales or rhonchi, symmetric air entry  Heart - normal rate, regular rhythm, normal S1, S2, no murmurs, rubs, clicks or gallops Extremities - dependent LE edema  without clubbing or cyanosis Skin - normal coloration and turgor, no rashes, no suspicious skin lesions noted  No hyperpigmentation of skin.  No current hematomas noted   Lab /Imaging Review    orders written for new lab studies as appropriate; see orders.   Assessment & Plan:  Carmen Mann is a 49 y.o. female    1. Women's annual routine gynecological examination   2. Need for prophylactic vaccination with combined diphtheria-tetanus-pertussis (DTP) vaccine   3. Essential hypertension   4. Severe episode of recurrent major depressive disorder, without psychotic features ([place])   5. Lump of axilla, right   6. Abnormal breast exam   7. Other fatigue     Orders Placed This Encounter  Procedures  . [place] AXILLA RIGHT  . Tdap vaccine greater than or equal to 7yo IM  . CMP14+EGFR  . TSH  . POCT urinalysis dipstick    Will start with ultrasound of axilla and then diagnostic mammogram.  Patient is inline with this plan.  Should labs for fatigue return and are negative, will consider referral.  Patient to call or return if she would like further f/u with psychology.  She is inline with this plan.     Orders Placed This Encounter  Procedures  . Tdap vaccine greater than or equal to 7yo IM   No orders of the defined types were placed in this encounter.    Glyn Ade, NP

## 2020-01-26 LAB — CMP14+EGFR
ALT: 8 IU/L (ref 0–32)
AST: 13 IU/L (ref 0–40)
Albumin/Globulin Ratio: 1.3 (ref 1.2–2.2)
Albumin: 4 g/dL (ref 3.8–4.8)
Alkaline Phosphatase: 96 IU/L (ref 39–117)
BUN/Creatinine Ratio: 13 (ref 9–23)
BUN: 9 mg/dL (ref 6–24)
Bilirubin Total: 0.3 mg/dL (ref 0.0–1.2)
CO2: 24 mmol/L (ref 20–29)
Calcium: 9.1 mg/dL (ref 8.7–10.2)
Chloride: 108 mmol/L — ABNORMAL HIGH (ref 96–106)
Creatinine, Ser: 0.7 mg/dL (ref 0.57–1.00)
GFR calc Af Amer: 118 mL/min/{1.73_m2} (ref >59)
GFR calc non Af Amer: 103 mL/min/{1.73_m2} (ref >59)
Globulin, Total: 3.2 g/dL (ref 1.5–4.5)
Glucose: 82 mg/dL (ref 65–99)
Potassium: 4.5 mmol/L (ref 3.5–5.2)
Sodium: 144 mmol/L (ref 134–144)
Total Protein: 7.2 g/dL (ref 6.0–8.5)

## 2020-01-26 LAB — TSH: TSH: 1.45 u[IU]/mL (ref 0.450–4.500)

## 2020-02-09 ENCOUNTER — Other Ambulatory Visit: Payer: Self-pay | Admitting: Family

## 2020-02-09 ENCOUNTER — Ambulatory Visit
Admission: RE | Admit: 2020-02-09 | Discharge: 2020-02-09 | Disposition: A | Discharge status: Home / Self Care | Payer: 59 | Source: Ambulatory Visit | Attending: Adult Health Nurse Practitioner | Admitting: Adult Health Nurse Practitioner

## 2020-02-09 ENCOUNTER — Ambulatory Visit
Admission: RE | Admit: 2020-02-09 | Discharge: 2020-02-09 | Disposition: A | Discharge status: Home / Self Care | Payer: 59 | Source: Ambulatory Visit | Attending: Family | Admitting: Family

## 2020-02-09 ENCOUNTER — Telehealth: Payer: Self-pay | Admitting: Adult Health Nurse Practitioner

## 2020-02-09 ENCOUNTER — Other Ambulatory Visit: Payer: Self-pay

## 2020-02-09 DIAGNOSIS — N644 Mastodynia: Secondary | ICD-10-CM

## 2020-02-09 DIAGNOSIS — N63 Unspecified lump in unspecified breast: Secondary | ICD-10-CM

## 2020-02-09 DIAGNOSIS — N6459 Other signs and symptoms in breast: Secondary | ICD-10-CM

## 2020-02-09 DIAGNOSIS — R599 Enlarged lymph nodes, unspecified: Secondary | ICD-10-CM

## 2020-02-09 DIAGNOSIS — R2231 Localized swelling, mass and lump, right upper limb: Secondary | ICD-10-CM

## 2020-02-09 NOTE — Telephone Encounter (Signed)
Pt called and is wanting to discuss her mammogram that she just had with provider. [phone]. Please advise.

## 2020-02-09 NOTE — Telephone Encounter (Signed)
I have called the pt back and she just had her mammogram done. Pt has received the report and she is concern about the enlarged lymph nod. Pt stated that she has breast cancer running in her family and is concern about the result.   Pt is requesting a Biopsy of that breast.   She would like to have it done as soon as possible.

## 2020-02-14 ENCOUNTER — Other Ambulatory Visit: Payer: Self-pay

## 2020-02-14 ENCOUNTER — Other Ambulatory Visit (HOSPITAL_COMMUNITY)
Admission: RE | Admit: 2020-02-14 | Discharge: 2020-02-14 | Disposition: A | Discharge status: Home / Self Care | Payer: 59 | Source: Ambulatory Visit | Attending: Adult Health Nurse Practitioner | Admitting: Adult Health Nurse Practitioner

## 2020-02-14 ENCOUNTER — Ambulatory Visit (INDEPENDENT_AMBULATORY_CARE_PROVIDER_SITE_OTHER): Payer: 59 | Admitting: Adult Health Nurse Practitioner

## 2020-02-14 VITALS — BP 129/84 | HR 94 | Temp 97.8°F | Ht 65.0 in | Wt 203.6 lb

## 2020-02-14 DIAGNOSIS — I10 Essential (primary) hypertension: Secondary | ICD-10-CM | POA: Diagnosis present

## 2020-02-14 DIAGNOSIS — R5383 Other fatigue: Secondary | ICD-10-CM

## 2020-02-14 DIAGNOSIS — Z01411 Encounter for gynecological examination (general) (routine) with abnormal findings: Secondary | ICD-10-CM | POA: Diagnosis not present

## 2020-02-14 DIAGNOSIS — Z01419 Encounter for gynecological examination (general) (routine) without abnormal findings: Secondary | ICD-10-CM | POA: Diagnosis present

## 2020-02-14 NOTE — Patient Instructions (Signed)
° ° ° °  If you have lab work done today you will be contacted with your lab results within the next 2 weeks.  If you have not heard from us then please contact us. The fastest way to get your results is to register for My Chart. ° ° °IF you received an x-ray today, you will receive an invoice from [facility]. Please contact [facility] at [phone] with questions or concerns regarding your invoice.  ° °IF you received labwork today, you will receive an invoice from [facility]. Please contact [facility] at [phone] with questions or concerns regarding your invoice.  ° °Our billing staff will not be able to assist you with questions regarding bills from these companies. ° °You will be contacted with the lab results as soon as they are available. The fastest way to get your results is to activate your My Chart account. Instructions are located on the last page of this paperwork. If you have not heard from us regarding the results in 2 weeks, please contact this office. °  ° ° ° °

## 2020-02-14 NOTE — Progress Notes (Signed)
SUBJECTIVE:  49 y.o. female for annual routine Pap and checkup.   Recently had the breast imaging and ultrasound axilla done completed.  Ultrasound was interpreted as bilateral enlargement of the axillary lymph nodes with multiple lymph nodes on the left and discrete enlargement of lymph node on the right.  Read of the ultrasound recommended close follow-up within a month to distinguish whether it is inflammatory or other process.  Patient I did discuss her family history and recently, she learned her cousin and her early 31s had developed breast cancer in died within 3 months of the diagnosis.  No family history and her grandmother.  Obviously, the patient is concerned and asked what the chances were of this not being cancerous.  Unfortunately, I do not know but it is reassuring that both are present and symmetric and appear to be inflammatory in nature.  However, that does not rule out a potential cancer diagnosis.  Periods are normal, every month, 5 days, mildly heavy in the first 2 days.  Patient continues to experience fatigue, shortness of breath, and recently had Covid.  She feels she has not recovered.  She does request a sedimentation rate there is a history of autoimmune disease in her family.  We will check today.  I fatigued and has felt that way for the past few years.  Her blood count has almost always been abnormal towards an iron deficiency anemia.  I asked whether she truly took iron as directed in the past for 6 to 12 weeks and she stated that she had.   Current Outpatient Medications  Medication Sig Dispense Refill  . albuterol (VENTOLIN HFA) 108 (90 Base) MCG/ACT inhaler Inhale 2 puffs into the lungs every 6 (six) hours as needed for wheezing or shortness of breath. 18 g 0  . lisinopril-hydrochlorothiazide (ZESTORETIC) 20-25 MG tablet Take 1 tablet by mouth daily. 90 tablet 3  . hydrochlorothiazide (HYDRODIURIL) 25 MG tablet Take 25 mg by mouth daily.    . naproxen (NAPROSYN)  500 MG tablet Take 1 tablet (500 mg total) by mouth 2 (two) times daily. (Patient not taking: Reported on 01/25/2020) 30 tablet 0   No current facility-administered medications for this visit.   Allergies: Patient has no known allergies.  Patient's last menstrual period was 02/02/2020 (exact date).  ROS:  Feeling well. No dyspnea or chest pain on exertion.  No abdominal pain, change in bowel habits, black or bloody stools.  No urinary tract symptoms. GYN ROS:  . No neurological complaints.  OBJECTIVE:  The patient appears well, alert, oriented x 3, in no distress. BP 129/84 (BP Location: Left Arm, Patient Position: Sitting, Cuff Size: Large)   Pulse 94   Temp 97.8 F (36.6 C) (Temporal)   Ht 5\' 5"  (1.651 m)   Wt 203 lb 9.6 oz (92.4 kg)   LMP 02/02/2020 (Exact Date)   SpO2 97%   BMI 33.88 kg/m  ENT normal.  Neck supple. No adenopathy or thyromegaly. PERLA. Lungs are clear, good air entry, no wheezes, rhonchi or rales. S1 and S2 normal, no murmurs, regular rate and rhythm. Abdomen soft without tenderness, guarding, mass or organomegaly. Extremities show no edema, normal peripheral pulses. Neurological is normal, no focal findings.  BREAST EXAM  PELVIC EXAM: normal external genitalia, vulva, vagina, cervix, uterus and adnexa  ASSESSMENT:  well woman  PLAN:  pap smear  04/03/2020, NP  counseled on STD prevention return annually or prn regular work

## 2020-02-15 LAB — LIPID PANEL
Chol/HDL Ratio: 3.9 ratio (ref 0.0–4.4)
Cholesterol, Total: 207 mg/dL — ABNORMAL HIGH (ref 100–199)
HDL: 53 mg/dL (ref >39)
LDL Chol Calc (NIH): 145 mg/dL — ABNORMAL HIGH (ref 0–99)
Triglycerides: 50 mg/dL (ref 0–149)
VLDL Cholesterol Cal: 9 mg/dL (ref 5–40)

## 2020-02-15 LAB — CBC WITH DIFFERENTIAL/PLATELET
Basophils Absolute: 0 10*3/uL (ref 0.0–0.2)
Basos: 1 %
EOS (ABSOLUTE): 0.2 10*3/uL (ref 0.0–0.4)
Eos: 3 %
Hematocrit: 34.1 % (ref 34.0–46.6)
Hemoglobin: 11.5 g/dL (ref 11.1–15.9)
Immature Grans (Abs): 0 10*3/uL (ref 0.0–0.1)
Immature Granulocytes: 0 %
Lymphocytes Absolute: 1.1 10*3/uL (ref 0.7–3.1)
Lymphs: 22 %
MCH: 30.7 pg (ref 26.6–33.0)
MCHC: 33.7 g/dL (ref 31.5–35.7)
MCV: 91 fL (ref 79–97)
Monocytes Absolute: 0.8 10*3/uL (ref 0.1–0.9)
Monocytes: 17 %
Neutrophils Absolute: 2.8 10*3/uL (ref 1.4–7.0)
Neutrophils: 57 %
Platelets: 271 10*3/uL (ref 150–450)
RBC: 3.75 x10E6/uL — ABNORMAL LOW (ref 3.77–5.28)
RDW: 12.5 % (ref 11.7–15.4)
WBC: 4.9 10*3/uL (ref 3.4–10.8)

## 2020-02-15 LAB — CYTOLOGY - PAP
Adequacy: ABSENT
Chlamydia: NEGATIVE
Comment: NEGATIVE
Comment: NEGATIVE
Comment: NORMAL
Diagnosis: NEGATIVE
Neisseria Gonorrhea: NEGATIVE
Trichomonas: NEGATIVE

## 2020-02-15 LAB — IRON,TIBC AND FERRITIN PANEL
Ferritin: 89 ng/mL (ref 15–150)
Iron Saturation: 10 % — ABNORMAL LOW (ref 15–55)
Iron: 29 ug/dL (ref 27–159)
Total Iron Binding Capacity: 281 ug/dL (ref 250–450)
UIBC: 252 ug/dL (ref 131–425)

## 2020-02-15 LAB — SEDIMENTATION RATE: Sed Rate: 48 mm/hr — ABNORMAL HIGH (ref 0–32)

## 2020-02-15 LAB — HIV ANTIBODY (ROUTINE TESTING W REFLEX): HIV Screen 4th Generation wRfx: NONREACTIVE

## 2020-02-16 ENCOUNTER — Telehealth (INDEPENDENT_AMBULATORY_CARE_PROVIDER_SITE_OTHER): Payer: 59 | Admitting: Adult Health Nurse Practitioner

## 2020-02-16 ENCOUNTER — Other Ambulatory Visit: Payer: Self-pay

## 2020-02-16 ENCOUNTER — Encounter: Payer: Self-pay | Admitting: Adult Health Nurse Practitioner

## 2020-02-16 DIAGNOSIS — R5383 Other fatigue: Secondary | ICD-10-CM | POA: Insufficient documentation

## 2020-02-16 DIAGNOSIS — R700 Elevated erythrocyte sedimentation rate: Secondary | ICD-10-CM

## 2020-02-16 DIAGNOSIS — F332 Major depressive disorder, recurrent severe without psychotic features: Secondary | ICD-10-CM | POA: Insufficient documentation

## 2020-02-16 DIAGNOSIS — N6459 Other signs and symptoms in breast: Secondary | ICD-10-CM | POA: Insufficient documentation

## 2020-02-16 DIAGNOSIS — R2231 Localized swelling, mass and lump, right upper limb: Secondary | ICD-10-CM | POA: Insufficient documentation

## 2020-02-16 NOTE — Patient Instructions (Signed)
° ° ° °  If you have lab work done today you will be contacted with your lab results within the next 2 weeks.  If you have not heard from us then please contact us. The fastest way to get your results is to register for My Chart. ° ° °IF you received an x-ray today, you will receive an invoice from [facility]. Please contact [facility] at [phone] with questions or concerns regarding your invoice.  ° °IF you received labwork today, you will receive an invoice from [facility]. Please contact [facility] at [phone] with questions or concerns regarding your invoice.  ° °Our billing staff will not be able to assist you with questions regarding bills from these companies. ° °You will be contacted with the lab results as soon as they are available. The fastest way to get your results is to activate your My Chart account. Instructions are located on the last page of this paperwork. If you have not heard from us regarding the results in 2 weeks, please contact this office. °  ° ° ° °

## 2020-02-16 NOTE — Progress Notes (Signed)
Telemedicine Encounter- SOAP NOTE Established Patient  This telephone encounter was conducted with the patient's (or proxy's) verbal consent via audio telecommunications: yes/no: Yes Patient was instructed to have this encounter in a suitably private space; and to only have persons present to whom they give permission to participate. In addition, patient identity was confirmed by use of name plus two identifiers (DOB and address).  I discussed the limitations, risks, security and privacy concerns of performing an evaluation and management service by telephone and the availability of in person appointments. I also discussed with the patient that there may be a patient responsible charge related to this service. The patient expressed understanding and agreed to proceed.  I spent a total of TIME; 0 MIN TO 60 MIN: 10 minutes talking with the patient or their proxy.  Chief Complaint  Patient presents with  . Results    pt is wanting to talk about her lab results. she is also having some concerns regarding her appt with oncology    Subjective   Carmen Mann is a 49 y.o. established patient. Telephone visit today for review lab results.   HPI   Patient has questions regarding her lab work and future appointment with oncology. Still having muscular pains and sedimentation rate that was elevated although not significantly so.        Patient Active Problem List   Diagnosis Date Noted  . Severe episode of recurrent major depressive disorder, without psychotic features (HCC) 02/16/2020  . Lump of axilla, right 02/16/2020  . Abnormal breast exam 02/16/2020  . Other fatigue 02/16/2020  . Women's annual routine gynecological examination 02/14/2020  . Fatigue 02/14/2020  . Need for prophylactic vaccination with combined diphtheria-tetanus-pertussis (DTP) vaccine 01/25/2020  . Globus sensation 02/02/2014  . Weight gain 02/02/2014  . Enuresis 10/13/2013  . Mastalgia 10/13/2013  . HTN  (hypertension) 10/13/2013  . 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 Sciatica of left side 10/13/2013    Past Medical History:  Diagnosis Date  . History of positive PPD, untreated   . Hyperlipidemia   . Hypertension   . 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 albuterol (VENTOLIN HFA) 108 (90 Base) MCG/ACT inhaler Inhale 2 puffs into the lungs every 6 (six) hours as needed for wheezing or shortness of breath. 18 g 0  . hydrochlorothiazide (HYDRODIURIL) 25 MG tablet Take 25 mg by mouth daily.    Marland Kitchen lisinopril-hydrochlorothiazide (ZESTORETIC) 20-25 MG tablet Take 1 tablet by mouth daily. 90 tablet 3  . naproxen (NAPROSYN) 500 MG tablet Take 1 tablet (500 mg total) by mouth 2 (two) times daily. (Patient not taking: Reported on 01/25/2020) 30 tablet 0   No current facility-administered medications for this visit.    No Known Allergies  Social History   Socioeconomic History  . Marital status: Married    Spouse name: Not on file  . Number of children: Not on file  . Years of education: Not on file  . Highest education level: Not on file  Occupational History  . Occupation: Teacher, adult education: [employer]  Tobacco Use  . Smoking status: Never Smoker  . Smokeless tobacco: Never Used  Substance and Sexual Activity  . Alcohol use: No  . Drug use: No  . Sexual activity: Not on file  Other Topics Concern  . 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 Difficulty of Paying Living Expenses:   Food Insecurity:   . Worried About Charity fundraiser in the Last Year:   . Arboriculturist in the Last Year:   Transportation Needs:   . Lexicographer (Medical):   Marland Kitchen Lack of Transportation (Non-Medical):   Physical Activity:   . Days of Exercise per Week:   . Minutes of Exercise per Session:   Stress:   . Feeling of Stress :   Social Connections:   . Frequency of Communication with Friends and Family:   . Frequency of Social Gatherings with Friends and Family:   . Attends Religious Services:   . Active Member of Clubs or Organizations:   . Attends Archivist Meetings:   Marland Kitchen Marital Status:   Intimate Partner Violence:   . Fear of Current or Ex-Partner:   . Emotionally Abused:   Marland Kitchen Physically Abused:   . Sexually Abused:     ROS  Review of Systems See HPI Constitution: No fevers or chills No malaise No diaphoresis Skin: No rash or itching Eyes: no blurry vision, no double vision GU: no dysuria or hematuria Neuro: no dizziness or headaches   Objective    General appearance: alert, well appearing, and in no distress. Mental Status: normal mood, behavior, speech, dress, motor activity, and thought processes.   Vitals as reported by the patient: There were no vitals filed for this visit.  [name] was seen today for results.  Diagnoses and all orders for this visit:  Sedimentation rate elevation -     Ambulatory referral to Rheumatology     I discussed the assessment and treatment plan with the patient. The patient was provided an opportunity to ask questions and all were answered. The patient agreed with the plan and demonstrated an understanding of the instructions.   The patient was advised to call back or seek an in-person evaluation if the symptoms worsen or if the condition fails to improve as anticipated.  I provided 15  minutes of non-face-to-face time during this encounter.  Glyn Ade, NP  Primary Care at [facility]

## 2020-02-28 ENCOUNTER — Telehealth: Payer: Self-pay | Admitting: Adult Health Nurse Practitioner

## 2020-02-28 NOTE — Telephone Encounter (Signed)
Error

## 2020-03-22 ENCOUNTER — Other Ambulatory Visit: Payer: Self-pay | Admitting: Family

## 2020-03-22 ENCOUNTER — Ambulatory Visit
Admission: RE | Admit: 2020-03-22 | Discharge: 2020-03-22 | Disposition: A | Discharge status: Home / Self Care | Payer: 59 | Source: Ambulatory Visit | Attending: Family | Admitting: Family

## 2020-03-22 ENCOUNTER — Other Ambulatory Visit: Payer: Self-pay

## 2020-03-22 DIAGNOSIS — R599 Enlarged lymph nodes, unspecified: Secondary | ICD-10-CM

## 2020-03-29 ENCOUNTER — Other Ambulatory Visit: Payer: Self-pay | Admitting: Family

## 2020-03-29 ENCOUNTER — Ambulatory Visit
Admission: RE | Admit: 2020-03-29 | Discharge: 2020-03-29 | Disposition: A | Discharge status: Home / Self Care | Payer: 59 | Source: Ambulatory Visit | Attending: Family | Admitting: Family

## 2020-03-29 ENCOUNTER — Other Ambulatory Visit: Payer: Self-pay

## 2020-03-29 DIAGNOSIS — R599 Enlarged lymph nodes, unspecified: Secondary | ICD-10-CM

## 2020-04-01 LAB — SURGICAL PATHOLOGY

## 2020-04-10 DIAGNOSIS — R700 Elevated erythrocyte sedimentation rate: Secondary | ICD-10-CM | POA: Insufficient documentation

## 2020-04-19 ENCOUNTER — Telehealth: Payer: Self-pay

## 2020-04-19 ENCOUNTER — Other Ambulatory Visit: Payer: Self-pay | Admitting: Adult Health Nurse Practitioner

## 2020-04-19 MED ORDER — ALBUTEROL SULFATE HFA 108 (90 BASE) MCG/ACT IN AERS: 2.0000 | INHALATION_SPRAY | Freq: Four times a day (QID) | RESPIRATORY_TRACT | 0 refills | Status: DC | PRN

## 2020-04-19 NOTE — Telephone Encounter (Signed)
Copied from CRM [id number]. Topic: Quick Communication - Rx Refill/Question >> Apr 19, 2020 10:12 AM Jaquita Rector A wrote: Medication: albuterol (VENTOLIN HFA) 108 (90 Base) MCG/ACT inhaler   Has the patient contacted their pharmacy? Yes.   (Agent: If no, request that the patient contact the pharmacy for the refill.) (Agent: If yes, when and what did the pharmacy advise?)  Preferred Pharmacy (with phone number or street name): [id number] [place], [place] - [address]  Phone:  [phone] Fax:  [phone]     Agent: Please be advised that RX refills may take up to 3 business days. We ask that you follow-up with your pharmacy.

## 2020-04-22 NOTE — Telephone Encounter (Signed)
Pt has appt with [place] on 06/21/2020

## 2020-06-21 ENCOUNTER — Other Ambulatory Visit: Payer: Self-pay

## 2020-06-21 ENCOUNTER — Encounter: Payer: Self-pay | Admitting: Family Medicine

## 2020-06-21 ENCOUNTER — Ambulatory Visit (INDEPENDENT_AMBULATORY_CARE_PROVIDER_SITE_OTHER): Payer: 59 | Admitting: Family Medicine

## 2020-06-21 VITALS — BP 123/86 | HR 71 | Temp 97.8°F | Ht 65.0 in | Wt 204.2 lb

## 2020-06-21 DIAGNOSIS — Z6833 Body mass index (BMI) 33.0-33.9, adult: Secondary | ICD-10-CM

## 2020-06-21 DIAGNOSIS — I10 Essential (primary) hypertension: Secondary | ICD-10-CM

## 2020-06-21 MED ORDER — OZEMPIC (0.25 OR 0.5 MG/DOSE) 2 MG/1.5ML ~~LOC~~ SOPN: 0.2500 mg | PEN_INJECTOR | SUBCUTANEOUS | 1 refills | Status: AC

## 2020-06-21 NOTE — Progress Notes (Signed)
7/23/202111:00 AM  Carmen Mann Oct 20, 1971, 49 y.o., female [id number]  Chief Complaint  Patient presents with  . New Patient (Initial Visit)    Wt loss    HPI:   Patient is a 49 y.o. female with past medical history significant for HTN, HLP, depression who presents today for TOC  Previous PCP Bryd NP, last OV 2020/03/09 Normal axilla LN bx Normal mammogram Repeat mammogram April 10, 2022Sed rate 48 in 04/10/2021patient reports elevated for past 10 years - at initial presentation at time of her father's death, workup negative, continues to have flare ups once a year around his death anniversary LDL 148, 2020-03-09 She is overall doing well She struggles with weight loss Has tried various diets, IF, low carb, high protein, tries 3-4 months Goes to the gym 3-4 x week, does cardio and weights Cooks traditional liberian food, hardly ever eats out Has not noticed any changes in fit clothing Lowest weight 150lb about 12 years ago Has done best with OTC diet pills Did not tolerate phenteramine Fasting glucose 82 Snores only when really tired, sleeps well, wakes up rested She takes lisinopril-hctz for her BP daily   Depression screen [facility] 2/9 06/21/2020 02/16/2020 02/14/2020  Decreased Interest 0 0 0  Down, Depressed, Hopeless 0 0 0  PHQ - 2 Score 0 0 0    Fall Risk  06/21/2020 02/16/2020 02/14/2020 01/25/2020  Falls in the past year? 0 0 0 0  Number falls in past yr: 0 0 0 0  Injury with Fall? 0 0 0 0  Follow up Falls evaluation completed Falls evaluation completed Falls evaluation completed Falls evaluation completed     No Known Allergies  Prior to Admission medications   Medication Sig Start Date End Date Taking? Authorizing Provider  albuterol (VENTOLIN HFA) 108 (90 Base) MCG/ACT inhaler Inhale 2 puffs into the lungs every 6 (six) hours as needed for wheezing or shortness of breath. 04/19/20  Yes Myles Lipps, MD  hydrochlorothiazide (HYDRODIURIL) 25 MG tablet Take  25 mg by mouth daily.   Yes [provider]  lisinopril-hydrochlorothiazide (ZESTORETIC) 20-25 MG tablet Take 1 tablet by mouth daily. 01/25/20  Yes Royal Hawthorn, NP  naproxen (NAPROSYN) 500 MG tablet Take 1 tablet (500 mg total) by mouth 2 (two) times daily. 12/27/17  Yes Horton, Mayer Masker, MD    Past Medical History:  Diagnosis Date  . History of positive PPD, untreated   . Hyperlipidemia   . Hypertension   . 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 CESAREAN SECTION    . TUBAL LIGATION      Social History   Tobacco Use  . Smoking status: Never Smoker  . Smokeless tobacco: Never Used  Substance Use Topics  . Alcohol use: No    Family History  Problem Relation Age of Onset  . Hypertension Mother   . Prostate cancer Father   . Cancer Father   . Heart disease Other   . Mental illness Brother     Review of Systems  Constitutional: Negative for chills and fever.  Respiratory: Negative for cough and shortness of breath.   Cardiovascular: Negative for chest pain, palpitations and leg swelling.  Gastrointestinal: Negative for abdominal pain, nausea and vomiting.  per hpi   OBJECTIVE:  Today's Vitals   06/21/20 1049  BP: (!) 123/86  Pulse: 71  Temp: 97.8 F (36.6  C)  TempSrc: Temporal  SpO2: 99%  Weight: (!) 204 lb 3.2 oz (92.6 kg)  Height: 5\' 5"  (1.651 m)   Body mass index is 33.98 kg/m.   Wt Readings from Last 3 Encounters:  06/21/20 (!) 204 lb 3.2 oz (92.6 kg)  02/14/20 203 lb 9.6 oz (92.4 kg)  01/25/20 208 lb 3.2 oz (94.4 kg)     Physical Exam Vitals and nursing note reviewed.  Constitutional:      Appearance: She is well-developed.  HENT:     Head: Normocephalic and atraumatic.     Mouth/Throat:     Pharynx: No oropharyngeal exudate.  Eyes:     General: No scleral icterus.    Conjunctiva/sclera: Conjunctivae normal.     Pupils: Pupils are equal, round, and reactive to light.    Cardiovascular:     Rate and Rhythm: Normal rate and regular rhythm.     Heart sounds: Normal heart sounds. No murmur heard.  No friction rub. No gallop.   Pulmonary:     Effort: Pulmonary effort is normal.     Breath sounds: Normal breath sounds. No wheezing or rales.  Musculoskeletal:     Cervical back: Neck supple.  Skin:    General: Skin is warm and dry.  Neurological:     Mental Status: She is alert and oriented to person, place, and time.     No results found for this or any previous visit (from the past 24 hour(s)).  No results found.   ASSESSMENT and PLAN  1. Essential hypertension Controlled. Continue current regime.   2. BMI 33.0-33.9,adult Discussed treatment options, trial of ozempic, reviewed r/se/b. Goal is to titrate to 2.4mg  weekly. Continue exercise program  Other orders - Semaglutide,0.25 or 0.5MG /DOS, (OZEMPIC, 0.25 OR 0.5 MG/DOSE,) 2 MG/1.5ML SOPN; Inject 0.1875 mLs (0.25 mg total) into the skin once a week.  Return in about 4 weeks (around 07/19/2020).    07/21/2020, MD Primary Care at [facility] [address] [place], [place] Ph.  [phone] Fax [phone]

## 2020-06-21 NOTE — Patient Instructions (Signed)
° ° ° °  If you have lab work done today you will be contacted with your lab results within the next 2 weeks.  If you have not heard from us then please contact us. The fastest way to get your results is to register for My Chart. ° ° °IF you received an x-ray today, you will receive an invoice from [facility]. Please contact [facility] at [phone] with questions or concerns regarding your invoice.  ° °IF you received labwork today, you will receive an invoice from [facility]. Please contact [facility] at [phone] with questions or concerns regarding your invoice.  ° °Our billing staff will not be able to assist you with questions regarding bills from these companies. ° °You will be contacted with the lab results as soon as they are available. The fastest way to get your results is to activate your My Chart account. Instructions are located on the last page of this paperwork. If you have not heard from us regarding the results in 2 weeks, please contact this office. °  ° ° ° °

## 2020-07-22 ENCOUNTER — Telehealth: Payer: Self-pay | Admitting: Family Medicine

## 2020-07-22 ENCOUNTER — Ambulatory Visit: Payer: 59 | Admitting: Family Medicine

## 2020-07-22 NOTE — Telephone Encounter (Signed)
Noted and FYI to provider. I believe pt did not want to come back.

## 2020-07-22 NOTE — Telephone Encounter (Signed)
Pt called and wanted to say that she did not take what provider recommended for today's appt (07/23/20) because it was too exspensive. Pt did not want to resch. Please advise.

## 2020-07-22 NOTE — Telephone Encounter (Signed)
Patient NO showed her appointment with me. She was referring to ozempic for weight loss. No further action needed.

## 2020-07-23 ENCOUNTER — Encounter: Payer: Self-pay | Admitting: Family Medicine

## 2020-11-07 IMAGING — MG DIGITAL DIAGNOSTIC BILAT W/ TOMO W/ CAD
6 of 10 series · 6 of 30 positions shown · non-contrast
Comparison: Previous exam(s).

CLINICAL DATA: Patient complains of a right axillary mass that has
been stable for many years. Patient's physician palpated an
abnormality in the left breast. Patient was diagnosed with COVID 19
approximately 1 month ago.

EXAM:
DIGITAL DIAGNOSTIC BILATERAL MAMMOGRAM WITH CAD AND TOMO
ULTRASOUND BILATERAL BREAST

[L TAN synth-2D]
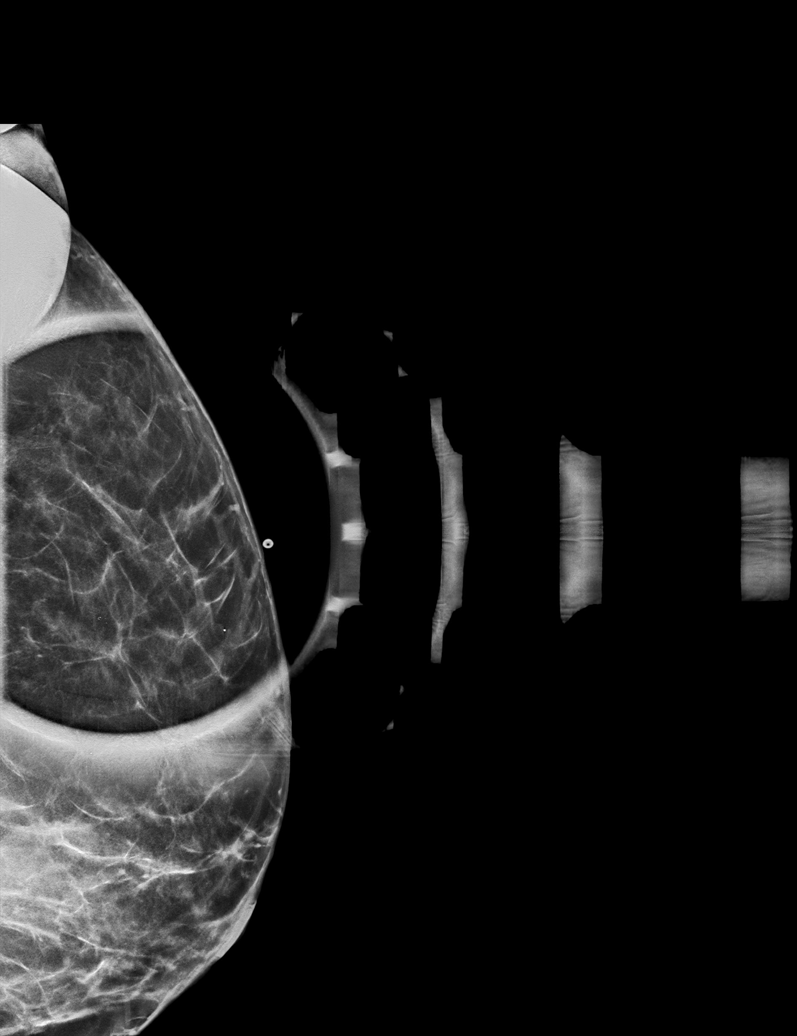

[L MLO synth-2D]
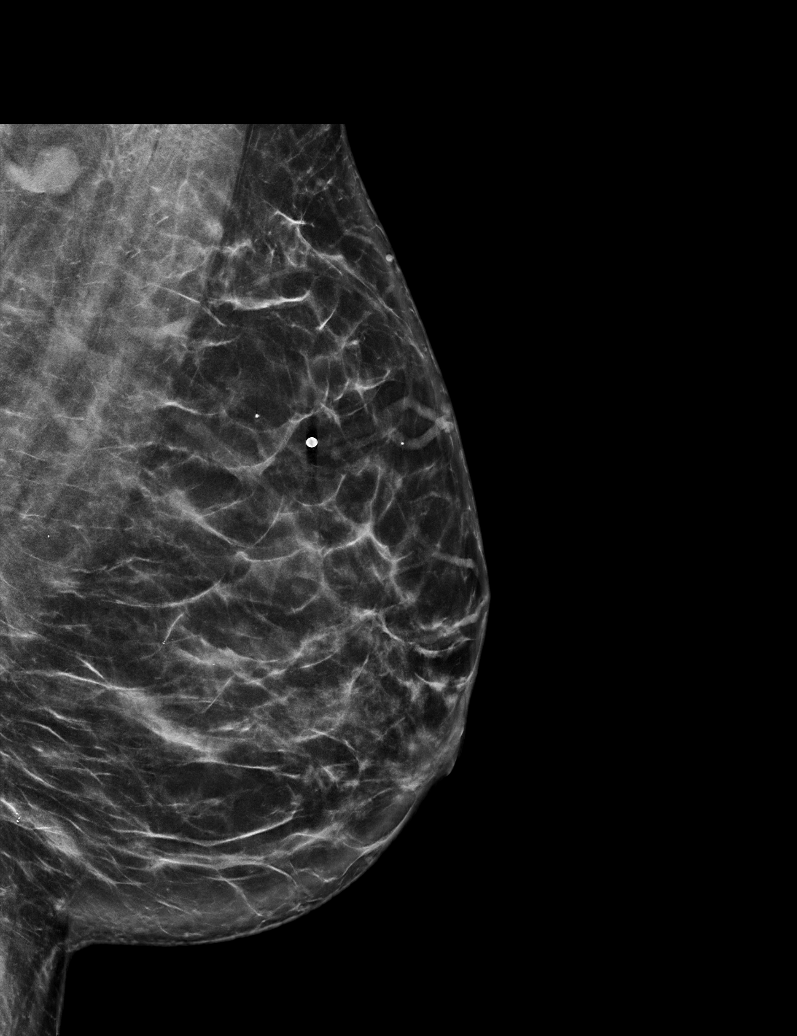

[R MLO synth-2D]
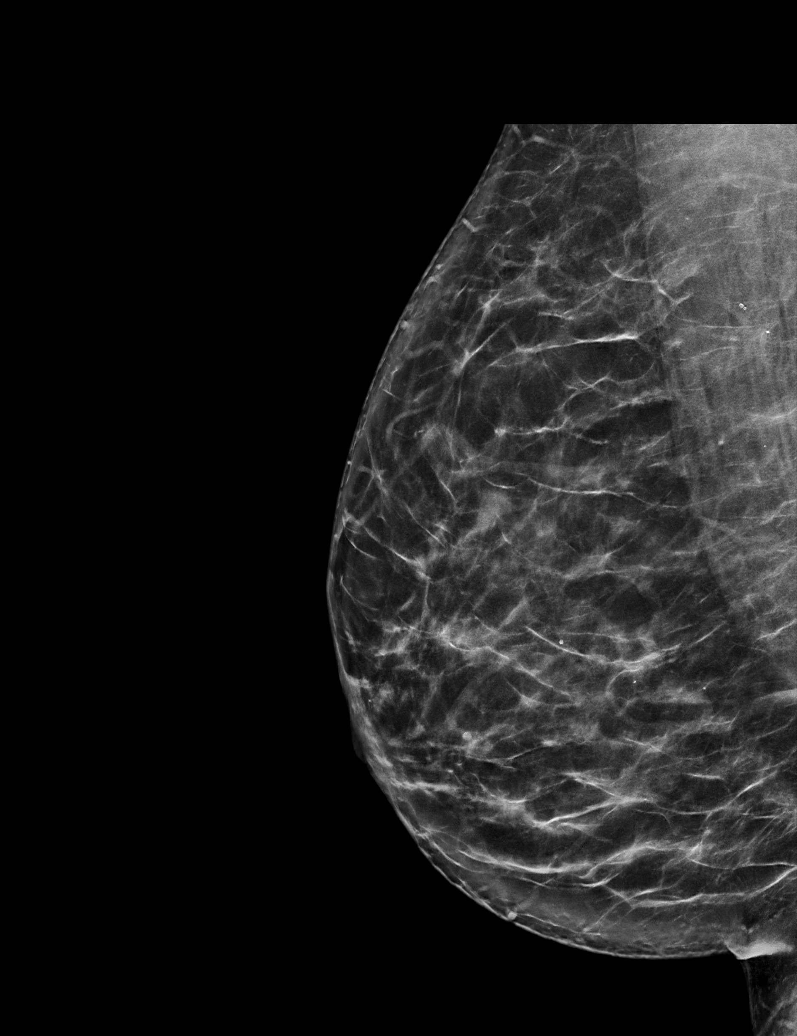

[L CC synth-2D]
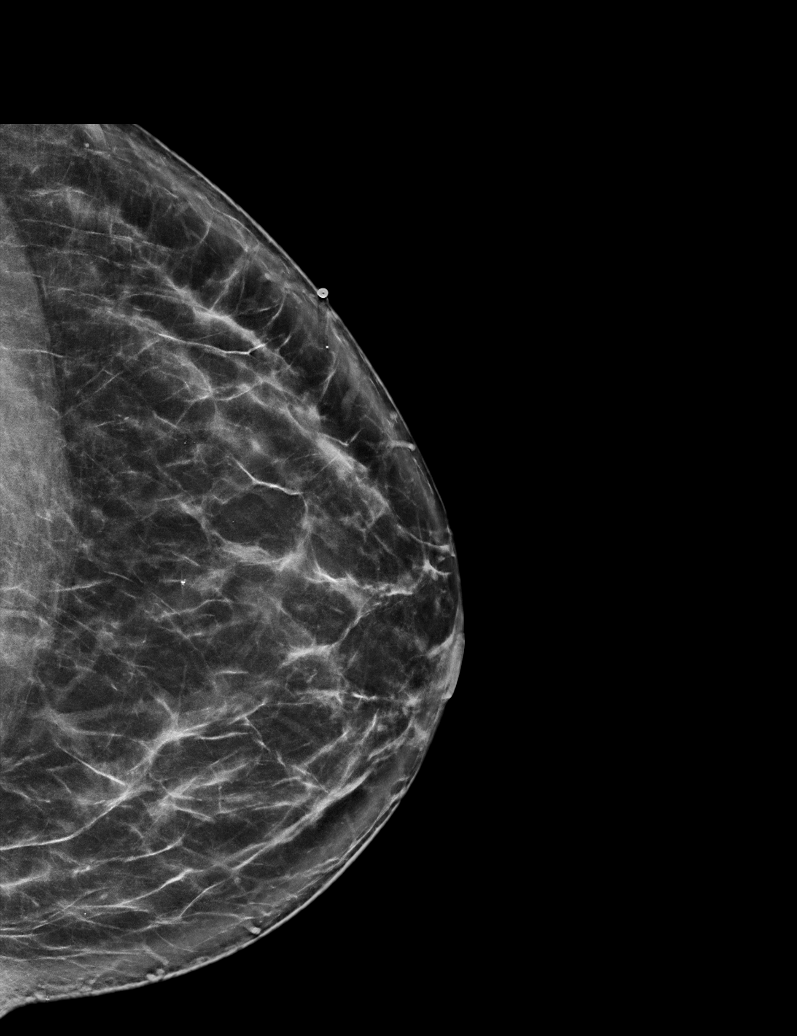

[R CC synth-2D]
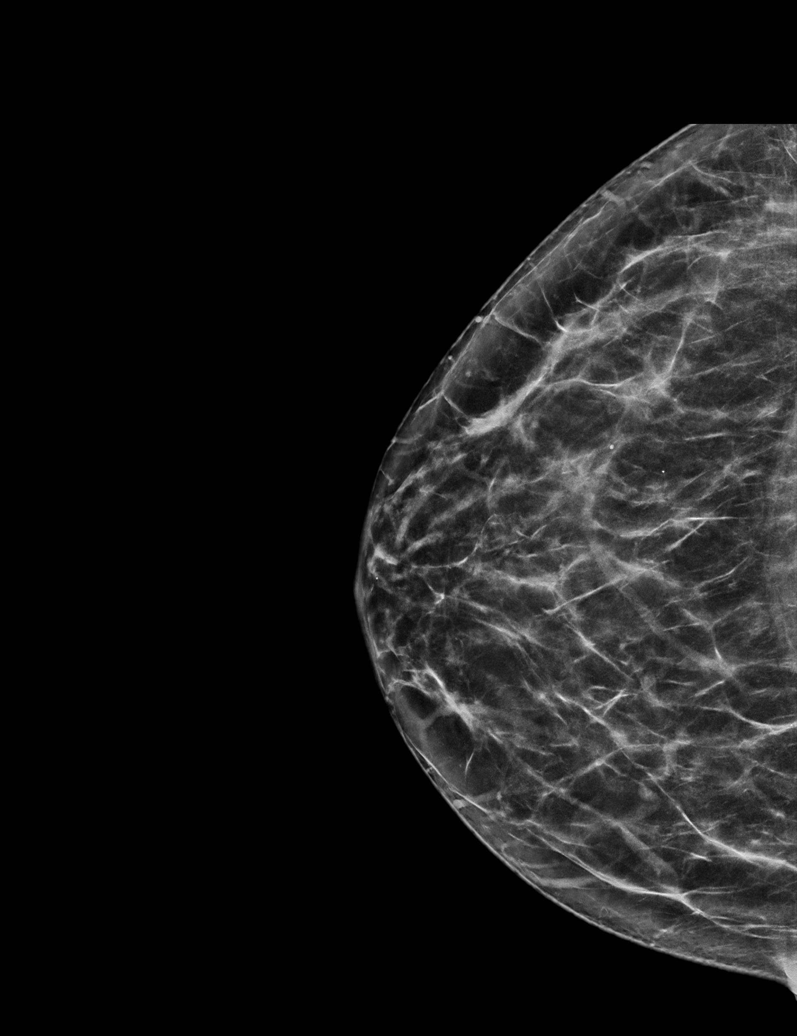

[R MLO tomo · tomo slice 39/76.0]
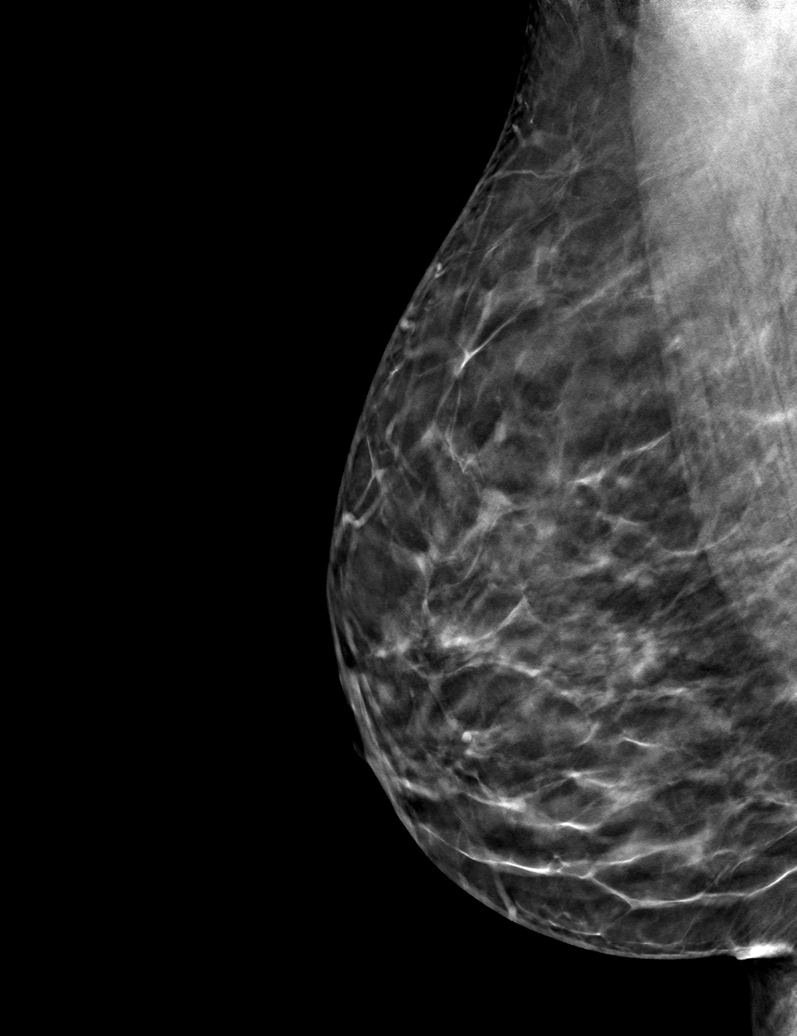

[6 of 30 positions shown; findings below may reference images not displayed]

ACR Breast Density Category c: The breast tissue is heterogeneously
dense, which may obscure small masses.
FINDINGS: There are 2 prominent lymph nodes seen in the left axilla on the MLO
view. No suspicious mass or malignant type microcalcifications
identified in either breast.

Mammographic images were processed with CAD.

On physical exam, I palpate a superficial BB sized nodule in the
right axilla. I do not palpate a mass in the upper-outer quadrant of
the left breast or left axilla.

Targeted ultrasound is performed, showing a hypoechoic mass with a
tract to the skin surface in the dermis of the right axilla
measuring 4 x 2 x 5 mm. Adjacent to it is a benign appearing 6 mm
lymph node.

Sonographic evaluation of the 2 o'clock region of the left breast 7
cm from the nipple shows normal fibroglandular tissue. Sonographic
evaluation of the left axilla shows a 1.7 cm lymph node with cortex
measuring 5 mm. There are several smaller adjacent normal looking
left axillary lymph nodes.
IMPRESSION: Probable benign reactive left axillary lymph nodes Patient was
diagnosed with COVID 19 approximately 1 month ago.

RECOMMENDATION:
Short-term interval follow-up left axillary ultrasound in 1 month is
recommended.

I have discussed the findings and recommendations with the patient.
If applicable, a reminder letter will be sent to the patient
regarding the next appointment.

BI-RADS CATEGORY  3: Probably benign.

## 2020-12-16 ENCOUNTER — Telehealth: Payer: 59 | Admitting: Family

## 2020-12-16 DIAGNOSIS — Z20822 Contact with and (suspected) exposure to covid-19: Secondary | ICD-10-CM

## 2020-12-16 MED ORDER — ALBUTEROL SULFATE HFA 108 (90 BASE) MCG/ACT IN AERS: 2.0000 | INHALATION_SPRAY | Freq: Four times a day (QID) | RESPIRATORY_TRACT | 0 refills | Status: AC | PRN

## 2020-12-16 MED ORDER — DEXAMETHASONE 6 MG PO TABS: 6.0000 mg | ORAL_TABLET | Freq: Every day | ORAL | 0 refills | Status: DC

## 2020-12-16 MED ORDER — BENZONATATE 100 MG PO CAPS: 100.0000 mg | ORAL_CAPSULE | Freq: Three times a day (TID) | ORAL | 0 refills | Status: DC | PRN

## 2020-12-16 NOTE — Progress Notes (Signed)
E-Visit for Corona Virus Screening  Your current symptoms could be consistent with the coronavirus.  Many health care providers can now test patients at their office but not all are.  [facility] has multiple testing sites. For information on our COVID testing locations and hours go to [email or web address]  We are enrolling you in our MyChart Home Monitoring for COVID19 . Daily you will receive a questionnaire within the MyChart website. Our COVID 19 response team will be monitoring your responses daily.  Testing Information: The COVID-19 Community Testing sites are testing BY APPOINTMENT ONLY.  You can schedule online at [email or web address]  If you do not have access to a smart phone or computer you may call [phone] for an appointment.   Additional testing sites in the Community:  . For [facility] Testing sites in [place]  https://www.cvs.com/minuteclinic/covid-19-testing  . For Pop-up testing sites in [place]  https://covid19.ncdhhs.gov/about-covid-19/testing/find-my-testing-place/pop-testing-sites  . For [facility] https://[place].gov[place]/human-services/health-department/coronavirus-covid-19-info/covid-19-testing  . For [place] testing in [place] https://www[place].gov/[place]/human-services/health-department/coronavirus-covid-19-info/covid-19-testing  . For [facility] testing in [place]   https://[facility].care/covidtesting  For  more information about community testing call [phone]   Please quarantine yourself while awaiting your test results. Please stay home for a minimum of 10 days from the first day of illness with improving symptoms and you have had 24 hours of no fever (without the use of Tylenol (Acetaminophen) Motrin (Ibuprofen) or any fever reducing medication).  Also - Do not get tested prior to returning to work because once you have had a positive test the test can stay  positive for more than a month in some cases.   You should wear a mask or cloth face covering over your nose and mouth if you must be around other people or animals, including pets (even at home). Try to stay at least 6 feet away from other people. This will protect the people around you.  Please continue good preventive care measures, including:  frequent hand-washing, avoid touching your face, cover coughs/sneezes, stay out of crowds and keep a 6 foot distance from others.  COVID-19 is a respiratory illness with symptoms that are similar to the flu. Symptoms are typically mild to moderate, but there have been cases of severe illness and death due to the virus.   The following symptoms may appear 2-14 days after exposure: . Fever . Cough . Shortness of breath or difficulty breathing . Chills . Repeated shaking with chills . Muscle pain . Headache . Sore throat . New loss of taste or smell . Fatigue . Congestion or runny nose . Nausea or vomiting . Diarrhea  Go to the nearest hospital ED for assessment if fever/cough/breathlessness are severe or illness seems like a threat to life.  It is vitally important that if you feel that you have an infection such as this virus or any other virus that you stay home and away from places where you may spread it to others.  You should avoid contact with people age 65 and older.   You can use medication such as A prescription cough medication called Tessalon Perles 100 mg. You may take 1-2 capsules every 8 hours as needed for cough and A prescription inhaler called Albuterol MDI 90 mcg /actuation 2 puffs every 4 hours as needed for shortness of breath, wheezing, cough and dexamethasone 6 mg twice a day for 7 days..   You may also take acetaminophen (Tylenol) as needed for fever.  Reduce your risk of any infection by using   the same precautions used for avoiding the common cold or flu:  . Wash your hands often with soap and warm water for at least 20  seconds.  If soap and water are not readily available, use an alcohol-based hand sanitizer with at least 60% alcohol.  . If coughing or sneezing, cover your mouth and nose by coughing or sneezing into the elbow areas of your shirt or coat, into a tissue or into your sleeve (not your hands). . Avoid shaking hands with others and consider head nods or verbal greetings only. . Avoid touching your eyes, nose, or mouth with unwashed hands.  . Avoid close contact with people who are sick. . Avoid places or events with large numbers of people in one location, like concerts or sporting events. . Carefully consider travel plans you have or are making. . If you are planning any travel outside or inside the [place], visit the CDC's Travelers' Health webpage for the latest health notices. . If you have some symptoms but not all symptoms, continue to monitor at home and seek medical attention if your symptoms worsen. . If you are having a medical emergency, call 911.  HOME CARE . Only take medications as instructed by your medical team. . Drink plenty of fluids and get plenty of rest. . A steam or ultrasonic humidifier can help if you have congestion.   GET HELP RIGHT AWAY IF YOU HAVE EMERGENCY WARNING SIGNS** FOR COVID-19. If you or someone is showing any of these signs seek emergency medical care immediately. Call 911 or proceed to your closest emergency facility if: . You develop worsening high fever. . Trouble breathing . Bluish lips or face . Persistent pain or pressure in the chest . New confusion . Inability to wake or stay awake . You cough up blood. . Your symptoms become more severe  **This list is not all possible symptoms. Contact your medical provider for any symptoms that are sever or concerning to you.  MAKE SURE YOU   Understand these instructions.  Will watch your condition.  Will get help right away if you are not doing well or get worse.  Your e-visit answers were reviewed by a  board certified advanced clinical practitioner to complete your personal care plan.  Depending on the condition, your plan could have included both over the counter or prescription medications.  If there is a problem please reply once you have received a response from your provider.  Your safety is important to us.  If you have drug allergies check your prescription carefully.    You can use MyChart to ask questions about today's visit, request a non-urgent call back, or ask for a work or school excuse for 24 hours related to this e-Visit. If it has been greater than 24 hours you will need to follow up with your provider, or enter a new e-Visit to address those concerns. You will get an e-mail in the next two days asking about your experience.  I hope that your e-visit has been valuable and will speed your recovery. Thank you for using e-visits.   Approximately 5 minutes was spent documenting and reviewing patient's chart.   

## 2020-12-17 ENCOUNTER — Telehealth: Payer: Self-pay | Admitting: Family

## 2020-12-17 NOTE — Telephone Encounter (Signed)
Called to discuss with patient about COVID-19 symptoms and the use of one of the available treatments for those with mild to moderate Covid symptoms and at a high risk of hospitalization.  Pt appears to qualify for outpatient treatment due to co-morbid conditions and/or a member of an at-risk group in accordance with the FDA Emergency Use Authorization.    Symptom onset: 12/14/20 Vaccinated: Yes Booster? No Immunocompromised? No Qualifiers: Hypertension, elevated SVI score, BMI 39   Attempted to contact Ms. Dempster and was unable to leave a voicemail as the mailbox was full.  A MyChart with additional information was sent.   Marcos Eke, NP 12/17/2020 1:35 PM

## 2021-01-02 ENCOUNTER — Telehealth: Payer: 59 | Admitting: Emergency Medicine

## 2021-01-02 DIAGNOSIS — L739 Follicular disorder, unspecified: Secondary | ICD-10-CM

## 2021-01-02 MED ORDER — DOXYCYCLINE HYCLATE 100 MG PO CAPS
100.0000 mg | ORAL_CAPSULE | Freq: Two times a day (BID) | ORAL | 0 refills | Status: DC
Start: 2021-01-02 — End: 2022-01-20

## 2021-01-02 NOTE — Progress Notes (Signed)
E Visit for Rash  We are sorry that you are not feeling well. Here is how we plan to help!  Based on what you shared with me it looks like you have folliculitis. Folliculitis looks like areas of skin redness, swelling, and warmth; it develops as a result of bacteria entering under the skin. Little red spots and/or bleeding can be seen in skin, and tiny surface sacs containing fluid can occur. Fever can be present. Cellulitis is almost always on one side of a body, and the lower limbs are the most common site of involvement.   I have prescribed:  Doxycycline 100 mg to be take twice a day for 10 days.  I also recommend warm compresses and that you be seen in-person if your symptoms don't improved in the next 24 hours.  HOME CARE:  . Take your medications as ordered and take all of them, even if the skin irritation appears to be healing.   GET HELP RIGHT AWAY IF:  . Symptoms that don't begin to go away within 48 hours. . Severe redness persists or worsens . If the area turns color, spreads or swells. . If it blisters and opens, develops yellow-brown crust or bleeds. . You develop a fever or chills. . If the pain increases or becomes unbearable.  . Are unable to keep fluids and food down.  MAKE SURE YOU    Understand these instructions.  Will watch your condition.  Will get help right away if you are not doing well or get worse.  Thank you for choosing an e-visit. Your e-visit answers were reviewed by a board certified advanced clinical practitioner to complete your personal care plan. Depending upon the condition, your plan could have included both over the counter or prescription medications. Please review your pharmacy choice. Make sure the pharmacy is open so you can pick up prescription now. If there is a problem, you may contact your provider through [employer] and have the prescription routed to another pharmacy. Your safety is important to [place]. If you have drug allergies  check your prescription carefully.  For the next 24 hours you can use MyChart to ask questions about today's visit, request a non-urgent call back, or ask for a work or school excuse. You will get an email in the next two days asking about your experience. I hope that your e-visit has been valuable and will speed your recovery.  Approximately 5 minutes was used in reviewing the patient's chart, questionnaire, prescribing medications, and documentation.

## 2021-03-17 ENCOUNTER — Other Ambulatory Visit: Payer: Self-pay | Admitting: Adult Health Nurse Practitioner

## 2021-03-17 DIAGNOSIS — Z1231 Encounter for screening mammogram for malignant neoplasm of breast: Secondary | ICD-10-CM

## 2021-05-07 ENCOUNTER — Ambulatory Visit: Payer: 59

## 2021-05-10 ENCOUNTER — Ambulatory Visit: Payer: 59

## 2022-01-20 ENCOUNTER — Other Ambulatory Visit: Payer: Self-pay

## 2022-01-20 ENCOUNTER — Emergency Department: Payer: Self-pay

## 2022-01-20 ENCOUNTER — Encounter: Payer: Self-pay | Admitting: Emergency Medicine

## 2022-01-20 ENCOUNTER — Emergency Department
Admission: EM | Admit: 2022-01-20 | Discharge: 2022-01-20 | Disposition: A | Discharge status: Home / Self Care | Payer: Self-pay | Attending: Emergency Medicine | Admitting: Emergency Medicine

## 2022-01-20 DIAGNOSIS — U071 COVID-19: Secondary | ICD-10-CM | POA: Insufficient documentation

## 2022-01-20 DIAGNOSIS — I10 Essential (primary) hypertension: Secondary | ICD-10-CM | POA: Insufficient documentation

## 2022-01-20 LAB — RESP PANEL BY RT-PCR (FLU A&B, COVID) ARPGX2
Influenza A by PCR: NEGATIVE
Influenza B by PCR: NEGATIVE
SARS Coronavirus 2 by RT PCR: POSITIVE — AB

## 2022-01-20 MED ORDER — ACETAMINOPHEN 500 MG PO TABS
1000.0000 mg | ORAL_TABLET | Freq: Once | ORAL | Status: AC
  Administered 2022-01-20: 1000 mg via ORAL
  Filled 2022-01-20: qty 2

## 2022-01-20 MED ORDER — GUAIFENESIN-CODEINE 100-10 MG/5ML PO SOLN: 5.0000 mL | ORAL | 0 refills | Status: DC | PRN

## 2022-01-20 MED ORDER — GUAIFENESIN-CODEINE 100-10 MG/5ML PO SOLN: 5.0000 mL | ORAL | 0 refills | Status: AC | PRN

## 2022-01-20 NOTE — Discharge Instructions (Signed)
Follow-up with your primary care provider if any continued problems or concerns.  Return to the emergency department immediately if you develop any shortness of breath or difficulty breathing.  Drink lots of fluids to stay hydrated.  Tylenol or ibuprofen as needed for fever and body aches.  A prescription for cough medication was sent to the pharmacy which contains a narcotic and can cause drowsiness.  Do not drive or operate machinery while taking this medication.  You are still considered contagious when you are running fever.

## 2022-01-20 NOTE — ED Provider Notes (Signed)
[facility] Provider Note    Event Date/Time   First MD Initiated Contact with Patient 01/20/22 1147     (approximate)   History   Cough and Fever   HPI  Carmen Mann is a 51 y.o. female   presents to the ED with complaint of cough, congestion, fever and chills.  Patient states that she just returned from [place] 3 weeks ago.  She developed respiratory symptoms and fatigue shortly after that.  Patient reports that she has not taken any medication for fever today.  Patient has a history of positive PPD, hypertension, fatigue, depression.  She did get the Moderna COVID-vaccine.      Physical Exam   Triage Vital Signs: ED Triage Vitals  Enc Vitals Group     BP 01/20/22 1049 116/78     Pulse Rate 01/20/22 1049 91     Resp 01/20/22 1049 16     Temp 01/20/22 1049 (!) 100.7 F (38.2 C)     Temp Source 01/20/22 1049 Oral     SpO2 01/20/22 1049 100 %     Weight 01/20/22 1046 210 lb (95.3 kg)     Height 01/20/22 1046 5\' 5"  (1.651 m)     Head Circumference --      Peak Flow --      Pain Score 01/20/22 1046 0     Pain Loc --      Pain Edu? --      Excl. in GC? --     Most recent vital signs: Vitals:   01/20/22 1049  BP: 116/78  Pulse: 91  Resp: 16  Temp: (!) 100.7 F (38.2 C)  SpO2: 100%     General: Awake, no distress.  Occasional cough noted. CV:  Good peripheral perfusion.  Heart regular rate and rhythm. Resp:  Normal effort.  Lungs are clear bilaterally.  Patient is able to talk in complete sentences without any shortness of breath or difficulty breathing. Abd:  No distention.  Other:     ED Results / Procedures / Treatments   Labs (all labs ordered are listed, but only abnormal results are displayed) Labs Reviewed  RESP PANEL BY RT-PCR (FLU A&B, COVID) ARPGX2 - Abnormal; Notable for the following components:      Result Value   SARS Coronavirus 2 by RT PCR POSITIVE (*)    All other components within normal limits      RADIOLOGY Chest x-ray images were reviewed by me without evidence of infiltrates.  Radiology report is negative for acute cardiopulmonary disease.    PROCEDURES:  Critical Care performed:   Procedures   MEDICATIONS ORDERED IN ED: Medications  acetaminophen (TYLENOL) tablet 1,000 mg (1,000 mg Oral Given 01/20/22 1239)     IMPRESSION / MDM / ASSESSMENT AND PLAN / ED COURSE  I reviewed the triage vital signs and the nursing notes.   Differential diagnosis includes, but is not limited to, influenza, COVID, bronchitis, pneumonia.  51 year old female presents to the ED with complaint of cough and congestion for approximately 3 weeks.  Patient reports that she did take the Moderna COVID-vaccine.  Patient has a history of hypertension, fatigue and depression along with a history of positive PPD.  Chest x-ray was negative for acute cardiopulmonary disease.  Influenza was negative however COVID test was positive and patient was made aware of the results of this and her chest x-ray.  Patient will continue with Tylenol and ibuprofen as needed for fever, drinking fluids to  stay hydrated and a prescription for guaifenesin with codeine was sent to the pharmacy to see if this will help with her cough especially at night.  She is aware that should she develop any shortness of breath or difficulty breathing she is to return to the emergency department immediately.   FINAL CLINICAL IMPRESSION(S) / ED DIAGNOSES   Final diagnoses:  COVID     Rx / DC Orders   ED Discharge Orders          Ordered    guaiFENesin-codeine 100-10 MG/5ML syrup  Every 4 hours PRN,   Status:  Discontinued        01/20/22 1248    guaiFENesin-codeine 100-10 MG/5ML syrup  Every 4 hours PRN        01/20/22 1248             Note:  This document was prepared using Dragon voice recognition software and may include unintentional dictation errors.   Tommi Rumps, PA-C 01/20/22 1305    [place],  MD 01/20/22 [phone]

## 2022-01-20 NOTE — ED Triage Notes (Signed)
Pt to ED via POV. Pt states that she just returned from [place] about 3 weeks ago. Pt reports that since that time, she has been having cough, fever, and chills. Pt states that she is also fatigued. Pt is in NAD.
